# Patient Record
Sex: Female | Born: 1985 | Race: White | Hispanic: No | Marital: Single | Smoking: Former smoker
Health system: Southern US, Community
[De-identification: ages and names within clinical notes are randomized; demographics above are authoritative.]

## PROBLEM LIST (undated history)

## (undated) DIAGNOSIS — R2230 Localized swelling, mass and lump, unspecified upper limb: Secondary | ICD-10-CM

## (undated) DIAGNOSIS — E669 Obesity, unspecified: Secondary | ICD-10-CM

## (undated) DIAGNOSIS — F419 Anxiety disorder, unspecified: Secondary | ICD-10-CM

## (undated) DIAGNOSIS — F32A Depression, unspecified: Secondary | ICD-10-CM

## (undated) DIAGNOSIS — D259 Leiomyoma of uterus, unspecified: Secondary | ICD-10-CM

## (undated) DIAGNOSIS — F329 Major depressive disorder, single episode, unspecified: Secondary | ICD-10-CM

## (undated) HISTORY — PX: US LT BREAST BX W LOC DEV 1ST LESION IMG BX SPEC US GUIDE: IMG5431

## (undated) HISTORY — DX: Obesity, unspecified: E66.9

## (undated) HISTORY — DX: Leiomyoma of uterus, unspecified: D25.9

## (undated) HISTORY — PX: BREAST BIOPSY: SHX20

## (undated) HISTORY — PX: TUBAL LIGATION: SHX77

---
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
## 2004-11-03 ENCOUNTER — Emergency Department: Payer: Self-pay | Admitting: Emergency Medicine

## 2004-11-06 ENCOUNTER — Observation Stay: Payer: Self-pay | Admitting: Obstetrics & Gynecology

## 2004-11-07 ENCOUNTER — Emergency Department: Payer: Self-pay | Admitting: Emergency Medicine

## 2004-12-08 ENCOUNTER — Emergency Department: Payer: Self-pay | Admitting: Emergency Medicine

## 2005-12-19 ENCOUNTER — Emergency Department: Payer: Self-pay | Admitting: Emergency Medicine

## 2006-01-21 ENCOUNTER — Emergency Department: Payer: Self-pay | Admitting: General Practice

## 2006-03-16 ENCOUNTER — Emergency Department: Payer: Self-pay | Admitting: Emergency Medicine

## 2006-05-04 ENCOUNTER — Observation Stay: Payer: Self-pay | Admitting: Unknown Physician Specialty

## 2006-07-04 ENCOUNTER — Observation Stay: Payer: Self-pay

## 2006-07-09 ENCOUNTER — Observation Stay: Payer: Self-pay

## 2006-07-12 ENCOUNTER — Observation Stay: Payer: Self-pay | Admitting: Obstetrics & Gynecology

## 2006-07-19 ENCOUNTER — Inpatient Hospital Stay: Payer: Self-pay | Admitting: Obstetrics & Gynecology

## 2006-12-17 ENCOUNTER — Ambulatory Visit: Payer: Self-pay | Admitting: Obstetrics & Gynecology

## 2006-12-25 ENCOUNTER — Ambulatory Visit: Payer: Self-pay | Admitting: Obstetrics & Gynecology

## 2007-11-22 ENCOUNTER — Ambulatory Visit: Payer: Self-pay | Admitting: Obstetrics & Gynecology

## 2007-11-26 ENCOUNTER — Ambulatory Visit: Payer: Self-pay | Admitting: Obstetrics & Gynecology

## 2007-11-30 ENCOUNTER — Inpatient Hospital Stay: Payer: Self-pay

## 2008-06-24 ENCOUNTER — Emergency Department: Payer: Self-pay | Admitting: Emergency Medicine

## 2008-09-02 ENCOUNTER — Emergency Department: Payer: Self-pay | Admitting: Emergency Medicine

## 2009-05-23 ENCOUNTER — Emergency Department: Payer: Self-pay | Admitting: Emergency Medicine

## 2009-08-16 ENCOUNTER — Emergency Department: Payer: Self-pay | Admitting: Emergency Medicine

## 2010-10-20 ENCOUNTER — Emergency Department: Payer: Self-pay | Admitting: Emergency Medicine

## 2013-09-19 ENCOUNTER — Emergency Department: Payer: Self-pay | Admitting: Emergency Medicine

## 2014-03-18 ENCOUNTER — Emergency Department: Payer: Self-pay | Admitting: Emergency Medicine

## 2014-03-20 ENCOUNTER — Emergency Department: Payer: Self-pay | Admitting: Emergency Medicine

## 2014-06-15 ENCOUNTER — Emergency Department: Payer: Self-pay | Admitting: Emergency Medicine

## 2014-06-16 ENCOUNTER — Emergency Department: Payer: Self-pay | Admitting: Emergency Medicine

## 2014-12-23 ENCOUNTER — Ambulatory Visit: Payer: Self-pay

## 2015-01-04 ENCOUNTER — Ambulatory Visit: Payer: Self-pay | Admitting: General Surgery

## 2015-01-06 ENCOUNTER — Ambulatory Visit (INDEPENDENT_AMBULATORY_CARE_PROVIDER_SITE_OTHER): Payer: PRIVATE HEALTH INSURANCE | Admitting: General Surgery

## 2015-01-06 ENCOUNTER — Encounter: Payer: Self-pay | Admitting: General Surgery

## 2015-01-06 VITALS — BP 120/78 | HR 82 | Resp 12 | Ht 65.0 in | Wt 212.0 lb

## 2015-01-06 DIAGNOSIS — D1720 Benign lipomatous neoplasm of skin and subcutaneous tissue of unspecified limb: Secondary | ICD-10-CM | POA: Insufficient documentation

## 2015-01-06 NOTE — Progress Notes (Signed)
Patient ID: Shelby Curtis, female   DOB: 03/23/1986, 29 y.o.   MRN: [id number]  Chief Complaint  Patient presents with  . Breast Problem    right axilla mass    HPI Shelby Curtis is a 29 y.o. female.  who presents for a breast evaluation. The most recent mammogram and ultrasound was done on 12-23-14.  Patient does perform regular self breast checks. She states she noticed a knot in her right axilla around December. She states it has gotten larger over the past 2 months. Denies pain or breast injury. She has two children, 11 and 8.  HPI  No past medical history on file.  Past Surgical History  Procedure Laterality Date  . Cesarean section  2004, 2007  . Tubal ligation      Family History  Problem Relation Age of Onset  . Cancer Maternal Grandmother 32    breast  . Cancer Paternal Grandmother 11    breast    Social History History  Substance Use Topics  . Smoking status: Never Smoker   . Smokeless tobacco: Never Used  . Alcohol Use: No    No Known Allergies  Current Outpatient Prescriptions  Medication Sig Dispense Refill  . EPINEPHrine 1 MG/ML KIT Inject as directed as needed.    . Inulin (FIBER CHOICE PO) Take 2 tablets by mouth daily.     No current facility-administered medications for this visit.    Review of Systems Review of Systems  Constitutional: Negative.   Respiratory: Negative.   Cardiovascular: Negative.   Neurological: Positive for headaches.    Blood pressure 120/78, pulse 82, resp. rate 12, height '5\' 5"'  (1.651 m), weight 212 lb (96.163 kg), last menstrual period 11/21/2014.  Physical Exam Physical Exam  Constitutional: She is oriented to person, place, and time. She appears well-developed and well-nourished.  Neck: Neck supple.  Cardiovascular: Normal rate, regular rhythm and normal heart sounds.   Pulmonary/Chest: Effort normal and breath sounds normal. Right breast exhibits no inverted nipple, no mass, no nipple discharge, no skin change  and no tenderness. Left breast exhibits no inverted nipple, no mass, no nipple discharge, no skin change and no tenderness.    Transverse crease bilateral nipples.  Lymphadenopathy:    She has no cervical adenopathy.    She has axillary adenopathy.  Soft fullness base of right axilla 6 x 8 cm.  Neurological: She is alert and oriented to person, place, and time.  Skin: Skin is warm and dry.    Data Reviewed Bilateral mammograms dated 12/23/2014 and right axillary sound were reviewed. Mammograms were unremarkable. Ultrasound showed evidence of a 6 x 6 cm isoechoic area thought to represent a lipoma. BI-RADS-2.  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     Plan    Options for management were reviewed: 1) observation with excision of the area continues to increase in size versus 2) elective excision under general anesthesia. Pros and cons of both were reviewed. There is essentially no clinical suspicion for malignancy based on review of her mammograms (no breast parenchyma in the area) as well as her ultrasound and clinical examination.  Has areas presently pain-free, she is elected observation.  She's been discouraged from manipulating this area, but was encouraged to call if she develops pain or appreciate marked enlargement.     Follow up in 3 months.  PCP: Princella Ion Ref : Koren Shiver RN/[facility]  Robert Bellow 01/06/2015, 8:23 PM

## 2015-01-06 NOTE — Patient Instructions (Signed)
The patient is aware to call back for any questions or concerns. Continue self breast exams. Call office for any new breast issues or concerns. Breast Self-Awareness Practicing breast self-awareness may pick up problems early, prevent significant medical complications, and possibly save your life. By practicing breast self-awareness, you can become familiar with how your breasts look and feel and if your breasts are changing. This allows you to notice changes early. It can also offer you some reassurance that your breast health is good. One way to learn what is normal for your breasts and whether your breasts are changing is to do a breast self-exam. If you find a lump or something that was not present in the past, it is best to contact your caregiver right away. Other findings that should be evaluated by your caregiver include nipple discharge, especially if it is bloody; skin changes or reddening; areas where the skin seems to be pulled in (retracted); or new lumps and bumps. Breast pain is seldom associated with cancer (malignancy), but should also be evaluated by a caregiver. HOW TO PERFORM A BREAST SELF-EXAM The best time to examine your breasts is 5-7 days after your menstrual period is over. During menstruation, the breasts are lumpier, and it may be more difficult to pick up changes. If you do not menstruate, have reached menopause, or had your uterus removed (hysterectomy), you should examine your breasts at regular intervals, such as monthly. If you are breastfeeding, examine your breasts after a feeding or after using a breast pump. Breast implants do not decrease the risk for lumps or tumors, so continue to perform breast self-exams as recommended. Talk to your caregiver about how to determine the difference between the implant and breast tissue. Also, talk about the amount of pressure you should use during the exam. Over time, you will become more familiar with the variations of your breasts and  more comfortable with the exam. A breast self-exam requires you to remove all your clothes above the waist. 1. Look at your breasts and nipples. Stand in front of a mirror in a room with good lighting. With your hands on your hips, push your hands firmly downward. Look for a difference in shape, contour, and size from one breast to the other (asymmetry). Asymmetry includes puckers, dips, or bumps. Also, look for skin changes, such as reddened or scaly areas on the breasts. Look for nipple changes, such as discharge, dimpling, repositioning, or redness. 2. Carefully feel your breasts. This is best done either in the shower or tub while using soapy water or when flat on your back. Place the arm (on the side of the breast you are examining) above your head. Use the pads (not the fingertips) of your three middle fingers on your opposite hand to feel your breasts. Start in the underarm area and use  inch (2 cm) overlapping circles to feel your breast. Use 3 different levels of pressure (light, medium, and firm pressure) at each circle before moving to the next circle. The light pressure is needed to feel the tissue closest to the skin. The medium pressure will help to feel breast tissue a little deeper, while the firm pressure is needed to feel the tissue close to the ribs. Continue the overlapping circles, moving downward over the breast until you feel your ribs below your breast. Then, move one finger-width towards the center of the body. Continue to use the  inch (2 cm) overlapping circles to feel your breast as you move slowly up  toward the collar bone (clavicle) near the base of the neck. Continue the up and down exam using all 3 pressures until you reach the middle of the chest. Do this with each breast, carefully feeling for lumps or changes. 3.  Keep a written record with breast changes or normal findings for each breast. By writing this information down, you do not need to depend only on memory for size,  tenderness, or location. Write down where you are in your menstrual cycle, if you are still menstruating. Breast tissue can have some lumps or thick tissue. However, see your caregiver if you find anything that concerns you.  SEEK MEDICAL CARE IF:  You see a change in shape, contour, or size of your breasts or nipples.   You see skin changes, such as reddened or scaly areas on the breasts or nipples.   You have an unusual discharge from your nipples.   You feel a new lump or unusually thick areas.  Document Released: 10/09/2005 Document Revised: 09/25/2012 Document Reviewed: 01/24/2012 [facility] Patient Information 2015 [place], [place]. This information is not intended to replace advice given to you by your health care provider. Make sure you discuss any questions you have with your health care provider.

## 2015-04-12 ENCOUNTER — Ambulatory Visit: Payer: PRIVATE HEALTH INSURANCE | Admitting: General Surgery

## 2015-05-25 ENCOUNTER — Encounter: Payer: Self-pay | Admitting: *Deleted

## 2015-08-20 ENCOUNTER — Encounter: Payer: Self-pay | Admitting: Emergency Medicine

## 2015-08-20 ENCOUNTER — Emergency Department
Admission: EM | Admit: 2015-08-20 | Discharge: 2015-08-20 | Disposition: A | Discharge status: Home / Self Care | Payer: Self-pay | Attending: Emergency Medicine | Admitting: Emergency Medicine

## 2015-08-20 DIAGNOSIS — N939 Abnormal uterine and vaginal bleeding, unspecified: Secondary | ICD-10-CM | POA: Insufficient documentation

## 2015-08-20 DIAGNOSIS — Z3202 Encounter for pregnancy test, result negative: Secondary | ICD-10-CM | POA: Insufficient documentation

## 2015-08-20 DIAGNOSIS — Z79899 Other long term (current) drug therapy: Secondary | ICD-10-CM | POA: Insufficient documentation

## 2015-08-20 LAB — BASIC METABOLIC PANEL
Anion gap: 6 (ref 5–15)
BUN: 17 mg/dL (ref 6–20)
CHLORIDE: 107 mmol/L (ref 101–111)
CO2: 25 mmol/L (ref 22–32)
Calcium: 9.3 mg/dL (ref 8.9–10.3)
Creatinine, Ser: 0.62 mg/dL (ref 0.44–1.00)
GFR calc Af Amer: >60 mL/min (ref >60)
GFR calc non Af Amer: >60 mL/min (ref >60)
GLUCOSE: 112 mg/dL — AB (ref 65–99)
POTASSIUM: 3.5 mmol/L (ref 3.5–5.1)
Sodium: 138 mmol/L (ref 135–145)

## 2015-08-20 LAB — CBC WITH DIFFERENTIAL/PLATELET
Basophils Absolute: 0.1 10*3/uL (ref 0–0.1)
Basophils Relative: 1 %
EOS PCT: 1 %
Eosinophils Absolute: 0.1 10*3/uL (ref 0–0.7)
HEMATOCRIT: 37.1 % (ref 35.0–47.0)
Hemoglobin: 12.1 g/dL (ref 12.0–16.0)
LYMPHS ABS: 1.9 10*3/uL (ref 1.0–3.6)
LYMPHS PCT: 22 %
MCH: 26.6 pg (ref 26.0–34.0)
MCHC: 32.6 g/dL (ref 32.0–36.0)
MCV: 81.6 fL (ref 80.0–100.0)
MONO ABS: 0.7 10*3/uL (ref 0.2–0.9)
Monocytes Relative: 8 %
Neutro Abs: 5.9 10*3/uL (ref 1.4–6.5)
Neutrophils Relative %: 68 %
PLATELETS: 225 10*3/uL (ref 150–440)
RBC: 4.55 MIL/uL (ref 3.80–5.20)
RDW: 13.4 % (ref 11.5–14.5)
WBC: 8.7 10*3/uL (ref 3.6–11.0)

## 2015-08-20 LAB — CHLAMYDIA/NGC RT PCR (ARMC ONLY)
Chlamydia Tr: NOT DETECTED
N GONORRHOEAE: NOT DETECTED

## 2015-08-20 LAB — WET PREP, GENITAL
TRICH WET PREP: NONE SEEN
YEAST WET PREP: NONE SEEN

## 2015-08-20 LAB — HCG, QUANTITATIVE, PREGNANCY: hCG, Beta Chain, Quant, S: <1 m[IU]/mL (ref <5)

## 2015-08-20 MED ORDER — MEDROXYPROGESTERONE ACETATE 5 MG PO TABS: 5.0000 mg | ORAL_TABLET | Freq: Every day | ORAL | Status: DC

## 2015-08-20 NOTE — ED Provider Notes (Signed)
[facility] Emergency Department Provider Note  ____________________________________________  Time seen: Approximately 11:11 AM  I have reviewed the triage vital signs and the nursing notes.   HISTORY  Chief Complaint Vaginal Bleeding   HPI Shelby Curtis is a 29 y.o. female here complaining of vaginal bleeding for 12 days. Patient states that she did not have a period the month of September. She is not using any birth control methods but states that she did have a tubal ligation 9 years ago. She also reports some mild lower abdominal cramping. Currently she states she is using one tampon per day but continues to pass some clots. She states prior to the bleeding that she did have some vaginal discharge. Currently she rates her pain is 7 out of 10.   History reviewed. No pertinent past medical history.  Patient Active Problem List   Diagnosis Date Noted  . 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 Cesarean section  2004, 2007  . Tubal ligation      Current Outpatient Rx  Name  Route  Sig  Dispense  Refill  . EPINEPHrine 1 MG/ML KIT   Injection   Inject as directed as needed.         . Inulin (FIBER CHOICE PO)   Oral   Take 2 tablets by mouth daily.         . medroxyPROGESTERone (PROVERA) 5 MG tablet   Oral   Take 1 tablet (5 mg total) by mouth daily.   7 tablet   0     Allergies Review of patient's allergies indicates no known allergies.  Family History  Problem Relation Age of Onset  . Cancer Maternal Grandmother 20    breast  . Cancer Paternal Grandmother 108    breast    Social History Social History  Substance Use Topics  . Smoking status: Never Smoker   . Smokeless tobacco: Never Used  . Alcohol Use: No    Review of Systems Constitutional: No fever/chills Cardiovascular: Denies chest pain. Respiratory: Denies shortness of breath. Gastrointestinal: Positive lower abdominal  pain.  No nausea, no vomiting.   Genitourinary: Negative for dysuria. Musculoskeletal: Negative for back pain. Skin: Negative for rash. Neurological: Negative for headaches, focal weakness or numbness.  10-point ROS otherwise negative.  ____________________________________________   PHYSICAL EXAM:  VITAL SIGNS: ED Triage Vitals  Enc Vitals Group     BP 08/20/15 1056 132/81 mmHg     Pulse Rate 08/20/15 1056 94     Resp 08/20/15 1056 20     Temp 08/20/15 1056 98.2 F (36.8 C)     Temp Source 08/20/15 1056 Oral     SpO2 08/20/15 1056 99 %     Weight 08/20/15 1056 212 lb (96.163 kg)     Height 08/20/15 1056 '5\' 5"'  (1.651 m)     Head Cir --      Peak Flow --      Pain Score 08/20/15 1056 7     Pain Loc --      Pain Edu? --      Excl. in [place]? --     Constitutional: Alert and oriented. Well appearing and in no acute distress. Eyes: Conjunctivae are normal. PERRL. EOMI. Head: Atraumatic. Nose: No congestion/rhinnorhea. Neck: No stridor.   Cardiovascular: Normal rate, regular rhythm. Grossly normal heart sounds.  Good peripheral circulation. Respiratory: Normal respiratory effort.  No retractions. Lungs CTAB. Gastrointestinal: Soft and  nontender. No distention. No abdominal bruits. No CVA tenderness. Genitourinary: There is minimal blood noted in the vaginal vault. This appears to be old rather than bright red. There is no tenderness on cervical motion and no adnexal masses or tenderness was noted. Musculoskeletal: No lower extremity tenderness nor edema.  No joint effusions. Neurologic:  Normal speech and language. No gross focal neurologic deficits are appreciated. No gait instability. Skin:  Skin is warm, dry and intact. No rash noted. Psychiatric: Mood and affect are normal. Speech and behavior are normal.  ____________________________________________   LABS (all labs ordered are listed, but only abnormal results are displayed)  Labs Reviewed  WET PREP, GENITAL -  Abnormal; Notable for the following:    Clue Cells Wet Prep HPF POC FEW (*)    WBC, Wet Prep HPF POC FEW (*)    All other components within normal limits  BASIC METABOLIC PANEL - Abnormal; Notable for the following:    Glucose, Bld 112 (*)    All other components within normal limits  CHLAMYDIA/NGC RT PCR ([facility] ONLY)  CBC WITH DIFFERENTIAL/PLATELET  HCG, QUANTITATIVE, PREGNANCY     PROCEDURES  Procedure(s) performed: None  Critical Care performed: No  ____________________________________________   INITIAL IMPRESSION / ASSESSMENT AND PLAN / ED COURSE  Pertinent labs & imaging results that were available during my care of the patient were reviewed by me and considered in my medical decision making (see chart for details).  Patient was started on Provera 5 mg one daily for 7 days. She was told that this would cause withdrawal bleeding that should clear up after that. Patient is to follow-up with Dr. Ilda Basset. She is aware that she is to call and make an appointment. ____________________________________________   FINAL CLINICAL IMPRESSION(S) / ED DIAGNOSES  Final diagnoses:  Abnormal uterine bleeding      Johnn Hai, PA-C 08/20/15 1721  Lisa Roca, MD 08/21/15 1606

## 2015-08-20 NOTE — ED Notes (Signed)
Pt to ed with c/o vaginal bleeding x 12 days.  Pt states she did not have a period in September.  Pt reports mild lower abd cramping.

## 2015-08-20 NOTE — Discharge Instructions (Signed)
Abnormal Uterine Bleeding Abnormal uterine bleeding means bleeding from the vagina that is not your normal menstrual period. This can be:  Bleeding or spotting between periods.  Bleeding after sex (sexual intercourse).  Bleeding that is heavier or more than normal.  Periods that last longer than usual.  Bleeding after menopause. There are many problems that may cause this. Treatment will depend on the cause of the bleeding. Any kind of bleeding that is not normal should be reviewed by your doctor.  HOME CARE Watch your condition for any changes. These actions may lessen any discomfort you are having:  Do not use tampons or douches as told by your doctor.  Change your pads often. You should get regular pelvic exams and Pap tests. Keep all appointments for tests as told by your doctor. GET HELP IF:  You are bleeding for more than 1 week.  You feel dizzy at times. GET HELP RIGHT AWAY IF:   You pass out.  You have to change pads every 15 to 30 minutes.  You have belly pain.  You have a fever.  You become sweaty or weak.  You are passing large blood clots from the vagina.  You feel sick to your stomach (nauseous) and throw up (vomit). MAKE SURE YOU:  Understand these instructions.  Will watch your condition.  Will get help right away if you are not doing well or get worse.   This information is not intended to replace advice given to you by your health care provider. Make sure you discuss any questions you have with your health care provider.   Document Released: 08/06/2009 Document Revised: 10/14/2013 Document Reviewed: 05/08/2013 Elsevier Interactive Patient Education 2016 [place] [address]. PICKENS IF ANY CONTINUED PROBLEMS.   YOU WILL NEED TO CALL FOR AN APPOINTMENT TAKE PROVERA ONCE A DAY FOR 7 DAYS, THEN YOU WILL EXPECT TO SEE MORE BLOOD BEFORE STOPPING

## 2016-11-27 ENCOUNTER — Emergency Department: Payer: Medicaid Other

## 2016-11-27 ENCOUNTER — Encounter: Payer: Self-pay | Admitting: Emergency Medicine

## 2016-11-27 ENCOUNTER — Emergency Department
Admission: EM | Admit: 2016-11-27 | Discharge: 2016-11-27 | Disposition: A | Discharge status: Home / Self Care | Payer: Medicaid Other | Attending: Emergency Medicine | Admitting: Emergency Medicine

## 2016-11-27 DIAGNOSIS — J101 Influenza due to other identified influenza virus with other respiratory manifestations: Secondary | ICD-10-CM

## 2016-11-27 DIAGNOSIS — J09X2 Influenza due to identified novel influenza A virus with other respiratory manifestations: Secondary | ICD-10-CM | POA: Insufficient documentation

## 2016-11-27 DIAGNOSIS — R079 Chest pain, unspecified: Secondary | ICD-10-CM

## 2016-11-27 DIAGNOSIS — R509 Fever, unspecified: Secondary | ICD-10-CM | POA: Diagnosis present

## 2016-11-27 DIAGNOSIS — Z79899 Other long term (current) drug therapy: Secondary | ICD-10-CM | POA: Diagnosis not present

## 2016-11-27 LAB — CBC WITH DIFFERENTIAL/PLATELET
BASOS PCT: 0 %
Basophils Absolute: 0 10*3/uL (ref 0–0.1)
EOS ABS: 0.1 10*3/uL (ref 0–0.7)
Eosinophils Relative: 1 %
HCT: 35.8 % (ref 35.0–47.0)
Hemoglobin: 12.8 g/dL (ref 12.0–16.0)
Lymphocytes Relative: 13 %
Lymphs Abs: 1.2 10*3/uL (ref 1.0–3.6)
MCH: 28.2 pg (ref 26.0–34.0)
MCHC: 35.7 g/dL (ref 32.0–36.0)
MCV: 79.1 fL — ABNORMAL LOW (ref 80.0–100.0)
MONOS PCT: 9 %
Monocytes Absolute: 0.8 10*3/uL (ref 0.2–0.9)
Neutro Abs: 7.2 10*3/uL — ABNORMAL HIGH (ref 1.4–6.5)
Neutrophils Relative %: 77 %
Platelets: 246 10*3/uL (ref 150–440)
RBC: 4.52 MIL/uL (ref 3.80–5.20)
RDW: 13.5 % (ref 11.5–14.5)
WBC: 9.4 10*3/uL (ref 3.6–11.0)

## 2016-11-27 LAB — BASIC METABOLIC PANEL
Anion gap: 8 (ref 5–15)
BUN: 16 mg/dL (ref 6–20)
CALCIUM: 8.8 mg/dL — AB (ref 8.9–10.3)
CO2: 24 mmol/L (ref 22–32)
CREATININE: 0.69 mg/dL (ref 0.44–1.00)
Chloride: 105 mmol/L (ref 101–111)
GFR calc non Af Amer: >60 mL/min (ref >60)
Glucose, Bld: 97 mg/dL (ref 65–99)
Potassium: 3 mmol/L — ABNORMAL LOW (ref 3.5–5.1)
Sodium: 137 mmol/L (ref 135–145)

## 2016-11-27 LAB — INFLUENZA PANEL BY PCR (TYPE A & B)
INFLBPCR: NEGATIVE
Influenza A By PCR: POSITIVE — AB

## 2016-11-27 MED ORDER — POTASSIUM CHLORIDE CRYS ER 20 MEQ PO TBCR
40.0000 meq | EXTENDED_RELEASE_TABLET | Freq: Once | ORAL | Status: AC
  Administered 2016-11-27: 40 meq via ORAL

## 2016-11-27 MED ORDER — ONDANSETRON 4 MG PO TBDP: 4.0000 mg | ORAL_TABLET | Freq: Four times a day (QID) | ORAL | 0 refills | Status: DC | PRN

## 2016-11-27 MED ORDER — IBUPROFEN 800 MG PO TABS
ORAL_TABLET | ORAL | Status: AC
  Administered 2016-11-27: 800 mg via ORAL
  Filled 2016-11-27: qty 1

## 2016-11-27 MED ORDER — ACETAMINOPHEN 500 MG PO TABS
ORAL_TABLET | ORAL | Status: AC
  Administered 2016-11-27: 1000 mg via ORAL
  Filled 2016-11-27: qty 2

## 2016-11-27 MED ORDER — IBUPROFEN 800 MG PO TABS
800.0000 mg | ORAL_TABLET | ORAL | Status: AC
  Administered 2016-11-27: 800 mg via ORAL

## 2016-11-27 MED ORDER — ONDANSETRON HCL 4 MG/2ML IJ SOLN
4.0000 mg | Freq: Once | INTRAMUSCULAR | Status: AC
  Administered 2016-11-27: 4 mg via INTRAVENOUS

## 2016-11-27 MED ORDER — POTASSIUM CHLORIDE CRYS ER 20 MEQ PO TBCR
EXTENDED_RELEASE_TABLET | ORAL | Status: AC
  Administered 2016-11-27: 40 meq via ORAL
  Filled 2016-11-27: qty 2

## 2016-11-27 MED ORDER — SODIUM CHLORIDE 0.9 % IV BOLUS (SEPSIS)
1000.0000 mL | Freq: Once | INTRAVENOUS | Status: AC
  Administered 2016-11-27: 1000 mL via INTRAVENOUS

## 2016-11-27 MED ORDER — ACETAMINOPHEN 500 MG PO TABS
1000.0000 mg | ORAL_TABLET | ORAL | Status: AC
  Administered 2016-11-27: 1000 mg via ORAL

## 2016-11-27 MED ORDER — ONDANSETRON HCL 4 MG/2ML IJ SOLN
INTRAMUSCULAR | Status: AC
  Administered 2016-11-27: 4 mg via INTRAVENOUS
  Filled 2016-11-27: qty 2

## 2016-11-27 NOTE — ED Provider Notes (Signed)
[facility] Emergency Department Provider Note   ____________________________________________   First MD Initiated Contact with Patient 11/27/16 2112     (approximate)  I have reviewed the triage vital signs and the nursing notes.   HISTORY  Chief Complaint Flu-like symptoms    HPI Shelby Curtis is a 31 y.o. female reports for the last 4 days she is experience muscle aches, fevers, chills, a dry nonproductive cough, has had a few episodes of vomiting, nausea and not wanting to eat well.  She has slight runny nose and also a dry cough.  Denies pregnancy. No abdominal pain, but has had vomiting over the last few days. 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  Denies any shortness of breath or chest pain.  No past medical history on file.  Patient Active Problem List   Diagnosis Date Noted  . 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 CESAREAN SECTION  2004, 2007  . TUBAL LIGATION      Prior to Admission medications   Medication Sig Start Date End Date Taking? Authorizing Provider  EPINEPHrine 1 MG/ML KIT Inject as directed as needed.    Historical Provider, MD  Inulin (FIBER CHOICE PO) Take 2 tablets by mouth daily.    Historical Provider, MD  medroxyPROGESTERone (PROVERA) 5 MG tablet Take 1 tablet (5 mg total) by mouth daily. 08/20/15 08/19/16  Johnn Hai, PA-C  ondansetron (ZOFRAN ODT) 4 MG disintegrating tablet Take 1 tablet (4 mg total) by mouth every 6 (six) hours as needed for nausea or vomiting. 11/27/16   Delman Kitten, MD    Allergies Patient has no known allergies.  Family History  Problem Relation Age of Onset  . Cancer Maternal Grandmother 9    breast  . Cancer Paternal Grandmother 62    breast    Social History Social History    Substance Use Topics  . Smoking status: Never Smoker  . Smokeless tobacco: Never Used  . Alcohol use No    Review of Systems Constitutional: Fevers and chills Eyes: No visual changes. ENT: No sore throat. Cardiovascular: Denies chest pain. Respiratory: Denies shortness of breath. Dry cough Gastrointestinal: No abdominal pain.  Occasional nausea and vomiting No diarrhea.  No constipation. Genitourinary: Negative for dysuria. Musculoskeletal: Negative for back pain. Skin: Negative for rash. Neurological: Negative for headaches, focal weakness or numbness.  10-point ROS otherwise negative.  ____________________________________________   PHYSICAL EXAM:  VITAL SIGNS: ED Triage Vitals  Enc Vitals Group     BP 11/27/16 1811 127/74     Pulse Rate 11/27/16 1811 (!) 130     Resp 11/27/16 1811 18     Temp 11/27/16 1811 99.4 F (37.4 C)     Temp Source 11/27/16 1811 Oral     SpO2 11/27/16 1811 96 %     Weight 11/27/16 1811 211 lb (95.7 kg)     Height 11/27/16 1811 _0  (1.651 m)     Head Circumference --      Peak Flow --      Pain Score 11/27/16 1812 6     Pain Loc --      Pain Edu? --      Excl. in [place]? --     Constitutional: Alert and oriented. Mildly ill-appearing, but  in no acute distress. Nontoxic in appearance. Patient and her mother very pleasant. Eyes: Conjunctivae are normal. PERRL. EOMI. Head: Atraumatic. Nose: Mild clear rhinorrhea Mouth/Throat: Mucous membranes are moist.  Oropharynx non-erythematous. Neck: No stridor.   Cardiovascular: Minimally tachycardic rate, regular rhythm. Grossly normal heart sounds.  Good peripheral circulation. Respiratory: Normal respiratory effort.  No retractions. Lungs CTAB. No wheezing. Speaks in full sentences without distress. Gastrointestinal: Soft and nontender. No distention.  Musculoskeletal: No lower extremity tenderness nor edema.  No joint effusions. Neurologic:  Normal speech and language. No gross focal neurologic  deficits are appreciated. No gait instability. Able to walk without difficulty. Skin:  Skin is warm, dry and intact. No rash noted. Psychiatric: Mood and affect are normal. Speech and behavior are normal.  ____________________________________________   LABS (all labs ordered are listed, but only abnormal results are displayed)  Labs Reviewed  CBC WITH DIFFERENTIAL/PLATELET - Abnormal; Notable for the following:       Result Value   MCV 79.1 (*)    Neutro Abs 7.2 (*)    All other components within normal limits  BASIC METABOLIC PANEL - Abnormal; Notable for the following:    Potassium 3.0 (*)    Calcium 8.8 (*)    All other components within normal limits  INFLUENZA PANEL BY PCR (TYPE A & B) - Abnormal; Notable for the following:    Influenza A By PCR POSITIVE (*)    All other components within normal limits   ____________________________________________  EKG  Reviewed and interpreted by me at 6:30 PM Ventricular rate 1:30 QRS 90 QTc 440 Sinus tachycardia, no acute ischemic changes ____________________________________________  RADIOLOGY  Dg Chest 2 View  Result Date: 11/27/2016 CLINICAL DATA:  Cough and nausea.  Chest tightness. EXAM: CHEST  2 VIEW COMPARISON:  None. FINDINGS: Lungs are clear. Heart size and pulmonary vascularity are normal. No adenopathy. No bone lesions. No pneumothorax. IMPRESSION: No abnormality noted. Electronically Signed   By: Lowella Grip III M.D.   On: 11/27/2016 18:43    ____________________________________________   PROCEDURES  Procedure(s) performed: None  Procedures  Critical Care performed: No  ____________________________________________   INITIAL IMPRESSION / ASSESSMENT AND PLAN / ED COURSE  Pertinent labs & imaging results that were available during my care of the patient were reviewed by me and considered in my medical decision making (see chart for details).  Patient presents with viral-like symptoms, still slightly  tachycardic, but in no distress. Overall nontoxic, no hypoxia.   Lab tests reveal influenza A, which appears consistent with her clinical history. She has no evidence of decompensation, and her tachycardia has corrected after receiving fluids. Patient reports nausea improved and feeling improved, requesting be discharged about 10:40 PM. Patient reports overall feeling much better, she is awake alert and able to ambulate on her own without difficulty.  Appears appropriate for outpatient management of influenza. We did discuss that she's had symptoms for about 4 days, is on clear fluid have any significant bearing on her illness at this point, and after discussing with her and also discussing relevant side effects profile of Tamiflu patient elected not to receive a prescription for Tamiflu, but does wish and we'll provide a prescription for Zofran. I think is extremely reasonable, she has no evidence of complicating factor appears well and improved after hydration and antiemetics.  I advised the patient and her mother on very careful return precautions, especially given how severe this flu season has been. Patient and mother both are agreeable with  plan.    Vitals:   11/27/16 2100 11/27/16 2241  BP: 103/70 95/69  Pulse: 97 100  Resp: 20 18  Temp: 99.9 F (37.7 C) 98.8 F (37.1 C)     ____________________________________________   FINAL CLINICAL IMPRESSION(S) / ED DIAGNOSES  Final diagnoses:  Influenza A      NEW MEDICATIONS STARTED DURING THIS VISIT:  Discharge Medication List as of 11/27/2016 10:43 PM    START taking these medications   Details  ondansetron (ZOFRAN ODT) 4 MG disintegrating tablet Take 1 tablet (4 mg total) by mouth every 6 (six) hours as needed for nausea or vomiting., Starting Mon 11/27/2016, Print         Note:  This document was prepared using Dragon voice recognition software and may include unintentional dictation errors.     Delman Kitten, MD 11/27/16  2300

## 2016-11-27 NOTE — ED Notes (Signed)
Pt reports she has been feeling sick since Friday reports body aches, fever, pt talks in complete sentence no respiratory distress noted. Denies any other symptom at present

## 2016-11-27 NOTE — ED Notes (Signed)
Pt alert and oriented X4, active, cooperative, pt in NAD. RR even and unlabored, color WNL.  Pt informed to return if any life threatening symptoms occur.   

## 2016-11-27 NOTE — ED Triage Notes (Addendum)
Pt reports cough, body aches, nausea, diarrhea and runny nose for three to four days. Pt reports dizziness and chest tightness.

## 2016-11-27 NOTE — Discharge Instructions (Signed)

## 2016-12-04 ENCOUNTER — Ambulatory Visit: Payer: Self-pay | Attending: Oncology | Admitting: *Deleted

## 2016-12-04 VITALS — BP 114/78 | HR 89 | Temp 97.1°F | Ht 64.96 in | Wt 212.9 lb

## 2016-12-04 DIAGNOSIS — N63 Unspecified lump in unspecified breast: Secondary | ICD-10-CM

## 2016-12-04 NOTE — Patient Instructions (Signed)
Gave patient hand-out, Women Staying Healthy, Active and Well from [facility], with education on breast health, pap smears, heart and colon health. 

## 2016-12-04 NOTE — Progress Notes (Signed)
Subjective:     Patient ID: Shelby Curtis, female   DOB: [birth year], 31 y.o.   MRN: [id number]  HPI   Review of Systems     Objective:   Physical Exam  Pulmonary/Chest: Right breast exhibits inverted nipple and mass. Right breast exhibits no nipple discharge, no skin change and no tenderness. Left breast exhibits inverted nipple. Left breast exhibits no mass, no nipple discharge, no skin change and no tenderness. Breasts are asymmetrical.         Assessment:     31 year old white female returns to [facility] with complaints of an enlarging right axillary mass.  States she is experiencing numbness down the right arm, and pain when raising her arm.  Does state she has had a 60 lbs weight gain over the past 2 years.  Also states she has lost 17 lbs, and has not noticed any change in the size of the mass.   In review of the chart, patient was seen through [facility] 12/23/14 for the same complaints.  Patient had a birads 2 mammogram at that time and was also seen by Dr. Bary Castilla who noted this was probably a lipoma.  On clinical breast exam I can visually see a large asymmetry in the axilla.  There is an approximate 8 cm mobile mass in the right axilla that is much easier palpated in the semi-fowlers position than supine.  There is slight excess breast tissue also noted in the left axilla.  Patient states she is starting to have some pain also in the left axilla.  Taught self breast awareness. Patient has been screened for eligibility.  She does not have any insurance, Medicare or Medicaid.  She also meets financial eligibility.  Hand-out given on the Affordable Care Act.    Plan:     Bilateral diagnostic mammogram with ultrasound.  Patient will also need to be scheduled to follow-up with Dr. Bary Castilla for further evaluation.  She is agreeable to the plan.  Will follow-up per [facility] protocol.  I did explain to the patient that if this is actually a lipoma that needs to be excised we will probably need to have her  complete patient financial paperwork through [facility].  She is agreeable.

## 2016-12-05 ENCOUNTER — Encounter: Payer: Self-pay | Admitting: *Deleted

## 2016-12-07 ENCOUNTER — Encounter: Payer: Self-pay | Admitting: *Deleted

## 2016-12-12 ENCOUNTER — Ambulatory Visit (INDEPENDENT_AMBULATORY_CARE_PROVIDER_SITE_OTHER): Payer: PRIVATE HEALTH INSURANCE | Admitting: General Surgery

## 2016-12-12 ENCOUNTER — Encounter: Payer: Self-pay | Admitting: General Surgery

## 2016-12-12 VITALS — BP 122/92 | HR 98 | Resp 12 | Ht 65.0 in | Wt 214.0 lb

## 2016-12-12 DIAGNOSIS — R2231 Localized swelling, mass and lump, right upper limb: Secondary | ICD-10-CM | POA: Insufficient documentation

## 2016-12-12 NOTE — Progress Notes (Signed)
Patient ID: Shelby Curtis, female   DOB: [birth year], 31 y.o.   MRN: [id number]  Chief Complaint  Patient presents with  . Breast Problem    HPI Shelby Curtis is a 31 y.o. female.  Here today for evaluation of a right axillary mass. She was seen in 2016 for the same area of concern. She states the knot is larger. She states occasional tenderness. She has lost 15 pounds recently. She states her arms tend to go numb with her arms over her head while sleeping. Mammogram an ultrasound scheduled for 12-22-16.  HPI  No past medical history on file.  Past Surgical History:  Procedure Laterality Date  . CESAREAN SECTION  2004, 2007  . TUBAL LIGATION      Family History  Problem Relation Age of Onset  . Cancer Maternal Grandmother 5    breast  . Cancer Paternal Grandmother 38    breast    Social History Social History  Substance Use Topics  . Smoking status: Never Smoker  . Smokeless tobacco: Never Used  . Alcohol use No    No Known Allergies  Current Outpatient Prescriptions  Medication Sig Dispense Refill  . EPINEPHrine 1 MG/ML KIT Inject as directed as needed.     No current facility-administered medications for this visit.     Review of Systems Review of Systems  Constitutional: Negative.   Respiratory: Negative.   Cardiovascular: Negative.     Blood pressure (!) 122/92, pulse 98, resp. rate 12, height '5\' 5"'  (1.651 m), weight 214 lb (97.1 kg), last menstrual period 11/27/2016.  Physical Exam Physical Exam  Constitutional: She is oriented to person, place, and time. She appears well-developed and well-nourished.  HENT:  Mouth/Throat: Oropharynx is clear and moist.  Eyes: Conjunctivae are normal. No scleral icterus.  Neck: Neck supple.  Cardiovascular: Normal rate, regular rhythm and normal heart sounds.   Pulmonary/Chest: Effort normal and breath sounds normal. Right breast exhibits no inverted nipple, no mass, no nipple discharge, no skin change and no  tenderness. Left breast exhibits no inverted nipple, no mass, no nipple discharge, no skin change and no tenderness.    Lymphadenopathy:    She has no cervical adenopathy.  Right axillary mass 8 x 12 cm.  Neurological: She is alert and oriented to person, place, and time.  Skin: Skin is warm and dry.  Psychiatric: Her behavior is normal.    Data Reviewed 12/23/2014 mammogram and right axillary ultrasound reviewed. No images available. 6 x 6 x 2 cm homogeneous area possibly representing a lipoma.  Assessment    Enlarging right axillary mass, lipoma versus ectopic breast tissue.    Plan    Patient will proceed with scheduled mammogram and ultrasound per the [facility] coordinator. She is mass to notify the office today her mammograms are completed so they can be reviewed.  We'll contact Mrs. Quentin Ore after mammograms to determine if the patient is eligible for pink ribbon fonds.    Recommend excision of the area. The patient is aware to call back for any questions or new concerns.   This information has been scribed by Karie Fetch RN, BSN,BC.   Robert Bellow 12/12/2016, 3:17 PM

## 2016-12-12 NOTE — Patient Instructions (Signed)
The patient is aware to call back for any questions or concerns.  

## 2016-12-22 ENCOUNTER — Ambulatory Visit
Admission: RE | Admit: 2016-12-22 | Discharge: 2016-12-22 | Disposition: A | Discharge status: Home / Self Care | Payer: Self-pay | Source: Ambulatory Visit | Attending: Oncology | Admitting: Oncology

## 2016-12-22 DIAGNOSIS — N63 Unspecified lump in unspecified breast: Secondary | ICD-10-CM

## 2017-01-18 ENCOUNTER — Telehealth: Payer: Self-pay | Admitting: *Deleted

## 2017-01-18 NOTE — Telephone Encounter (Signed)
Called and talked with patient today in regards to her mammogram, ultrasound results and conversation with Dr. Bary Castilla.  Patient states she would like to have the lipoma's removed.  She understands [facility] cannot pay for surgery, but she can complete forms for financial assistance from [facility].  She is going to pick up the paperwork tomorrow and bring them back when completed.  She is to discuss this with Dr. Bary Castilla.

## 2017-02-05 ENCOUNTER — Telehealth: Payer: Self-pay | Admitting: *Deleted

## 2017-02-05 NOTE — Telephone Encounter (Signed)
Patient contacted the office today wanting to know if she could schedule an excision.   This patient states [facility] will not cover cost. She is working on trying to obtain financial assistance/Medicaid.   Per Gwenette Greet, we will need to wait until patient is approved for assistance/Medicaid before we schedule and excision will need to be completed under date ranges of approval.

## 2017-07-04 ENCOUNTER — Encounter: Payer: Self-pay | Admitting: Emergency Medicine

## 2017-07-04 DIAGNOSIS — K529 Noninfective gastroenteritis and colitis, unspecified: Secondary | ICD-10-CM | POA: Insufficient documentation

## 2017-07-04 DIAGNOSIS — Z87891 Personal history of nicotine dependence: Secondary | ICD-10-CM | POA: Insufficient documentation

## 2017-07-04 DIAGNOSIS — N83201 Unspecified ovarian cyst, right side: Secondary | ICD-10-CM | POA: Insufficient documentation

## 2017-07-04 LAB — COMPREHENSIVE METABOLIC PANEL
ALK PHOS: 56 U/L (ref 38–126)
ALT: 15 U/L (ref 14–54)
ANION GAP: 9 (ref 5–15)
AST: 17 U/L (ref 15–41)
Albumin: 4.2 g/dL (ref 3.5–5.0)
BILIRUBIN TOTAL: 0.4 mg/dL (ref 0.3–1.2)
BUN: 13 mg/dL (ref 6–20)
CALCIUM: 9 mg/dL (ref 8.9–10.3)
CO2: 26 mmol/L (ref 22–32)
Chloride: 107 mmol/L (ref 101–111)
Creatinine, Ser: 0.63 mg/dL (ref 0.44–1.00)
GFR calc non Af Amer: >60 mL/min (ref >60)
Glucose, Bld: 117 mg/dL — ABNORMAL HIGH (ref 65–99)
POTASSIUM: 3.7 mmol/L (ref 3.5–5.1)
SODIUM: 142 mmol/L (ref 135–145)
TOTAL PROTEIN: 7.1 g/dL (ref 6.5–8.1)

## 2017-07-04 LAB — URINALYSIS, COMPLETE (UACMP) WITH MICROSCOPIC
BILIRUBIN URINE: NEGATIVE
Glucose, UA: NEGATIVE mg/dL
HGB URINE DIPSTICK: NEGATIVE
Ketones, ur: NEGATIVE mg/dL
Leukocytes, UA: NEGATIVE
NITRITE: NEGATIVE
Protein, ur: NEGATIVE mg/dL
Specific Gravity, Urine: 1.02 (ref 1.005–1.030)
WBC UA: NONE SEEN WBC/hpf (ref 0–5)
pH: 7.5 (ref 5.0–8.0)

## 2017-07-04 LAB — CBC
HEMATOCRIT: 37.5 % (ref 35.0–47.0)
HEMOGLOBIN: 12.6 g/dL (ref 12.0–16.0)
MCH: 27.5 pg (ref 26.0–34.0)
MCHC: 33.6 g/dL (ref 32.0–36.0)
MCV: 81.9 fL (ref 80.0–100.0)
Platelets: 260 10*3/uL (ref 150–440)
RBC: 4.58 MIL/uL (ref 3.80–5.20)
RDW: 13.2 % (ref 11.5–14.5)
WBC: 14.5 10*3/uL — ABNORMAL HIGH (ref 3.6–11.0)

## 2017-07-04 LAB — POCT PREGNANCY, URINE: Preg Test, Ur: NEGATIVE

## 2017-07-04 LAB — LIPASE, BLOOD: Lipase: 22 U/L (ref 11–51)

## 2017-07-04 NOTE — ED Triage Notes (Signed)
Patient with complaint of lower abdominal pain and vomiting that started this morning. Patient reports vomiting times 10 today.

## 2017-07-05 ENCOUNTER — Emergency Department: Payer: Self-pay

## 2017-07-05 ENCOUNTER — Emergency Department
Admission: EM | Admit: 2017-07-05 | Discharge: 2017-07-05 | Disposition: A | Discharge status: Home / Self Care | Payer: Self-pay | Attending: Emergency Medicine | Admitting: Emergency Medicine

## 2017-07-05 DIAGNOSIS — K529 Noninfective gastroenteritis and colitis, unspecified: Secondary | ICD-10-CM

## 2017-07-05 DIAGNOSIS — N83201 Unspecified ovarian cyst, right side: Secondary | ICD-10-CM

## 2017-07-05 MED ORDER — ONDANSETRON HCL 4 MG/2ML IJ SOLN
INTRAMUSCULAR | Status: AC
  Filled 2017-07-05: qty 2

## 2017-07-05 MED ORDER — MORPHINE SULFATE (PF) 2 MG/ML IV SOLN
INTRAVENOUS | Status: AC
  Filled 2017-07-05: qty 1

## 2017-07-05 MED ORDER — MORPHINE SULFATE (PF) 2 MG/ML IV SOLN
2.0000 mg | Freq: Once | INTRAVENOUS | Status: AC
  Administered 2017-07-05: 2 mg via INTRAVENOUS

## 2017-07-05 MED ORDER — SODIUM CHLORIDE 0.9 % IV BOLUS (SEPSIS)
1000.0000 mL | Freq: Once | INTRAVENOUS | Status: AC
  Administered 2017-07-05: 1000 mL via INTRAVENOUS

## 2017-07-05 MED ORDER — ONDANSETRON HCL 4 MG/2ML IJ SOLN
4.0000 mg | Freq: Once | INTRAMUSCULAR | Status: AC
  Administered 2017-07-05: 4 mg via INTRAVENOUS

## 2017-07-05 MED ORDER — IOPAMIDOL (ISOVUE-300) INJECTION 61%
100.0000 mL | Freq: Once | INTRAVENOUS | Status: AC | PRN
  Administered 2017-07-05: 100 mL via INTRAVENOUS

## 2017-07-05 MED ORDER — ONDANSETRON 4 MG PO TBDP: 4.0000 mg | ORAL_TABLET | Freq: Three times a day (TID) | ORAL | 0 refills | Status: DC | PRN

## 2017-07-05 NOTE — ED Notes (Signed)
Report from Amy, RN at this time.  

## 2017-07-05 NOTE — ED Provider Notes (Signed)
[facility] Emergency Department Provider Note    First MD Initiated Contact with Patient 07/05/17 0148     (approximate)  I have reviewed the triage vital signs and the nursing notes.   HISTORY  Chief Complaint Emesis and Abdominal Pain    HPI Shelby Curtis is a 31 y.o. female presents to the emergency department with lower abdominal pain that is currently 6 out of 10 that started yesterday morning accompanied by approximate 10 episodes of vomiting. Patient denies any fever afebrile on presentation. Patient admits to one episode of loose stool.   Past medical history  Patient Active Problem List   Diagnosis Date Noted  . Axillary mass, right 12/12/2016  . 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 CESAREAN SECTION  2004, 2007  . TUBAL LIGATION      Prior to Admission medications   Medication Sig Start Date End Date Taking? Authorizing Provider  EPINEPHrine 1 MG/ML KIT Inject as directed as needed.    [provider]    Allergies No known drug allergies   Family History  Problem Relation Age of Onset  . Cancer Maternal Grandmother 40       breast  . Breast cancer Maternal Grandmother     Social History Social History  Substance Use Topics  . Smoking status: Former Research scientist (life sciences)  . Smokeless tobacco: Never Used  . Alcohol use No    Review of Systems Constitutional: No fever/chills Eyes: No visual changes. ENT: No sore throat. Cardiovascular: Denies chest pain. Respiratory: Denies shortness of breath. Gastrointestinal: Positive for abdominal pain and vomiting  Genitourinary: Negative for dysuria. Musculoskeletal: Negative for neck pain.  Negative for back pain. Integumentary: Negative for rash. Neurological: Negative for headaches, focal weakness or numbness.   ____________________________________________   PHYSICAL EXAM:  VITAL SIGNS: ED Triage Vitals  Enc Vitals Group    BP 07/04/17 2210 (!) 146/90     Pulse Rate 07/04/17 2210 93     Resp 07/04/17 2210 18     Temp 07/04/17 2210 98.9 F (37.2 C)     Temp Source 07/04/17 2210 Oral     SpO2 07/04/17 2210 99 %     Weight 07/04/17 2211 86.2 kg (190 lb)     Height 07/04/17 2211 1.651 m (_0 )     Head Circumference --      Peak Flow --      Pain Score 07/04/17 2210 7     Pain Loc --      Pain Edu? --      Excl. in [place]? --     Constitutional: Alert and oriented. Well appearing and in no acute distress. Eyes: Conjunctivae are normal.  Head: Atraumatic. Mouth/Throat: Mucous membranes are moist.  Oropharynx non-erythematous. Neck: No stridor.   Cardiovascular: Normal rate, regular rhythm. Good peripheral circulation. Grossly normal heart sounds. Respiratory: Normal respiratory effort.  No retractions. Lungs CTAB. Gastrointestinal: Right lower quadrant tenderness to palpation. No distention.  Musculoskeletal: No lower extremity tenderness nor edema. No gross deformities of extremities. Neurologic:  Normal speech and language. No gross focal neurologic deficits are appreciated.  Skin:  Skin is warm, dry and intact. No rash noted. Psychiatric: Mood and affect are normal. Speech and behavior are normal.  ____________________________________________   LABS (all labs ordered are listed, but only abnormal results are displayed)  Labs Reviewed  COMPREHENSIVE METABOLIC PANEL - Abnormal; Notable for the following:  Result Value   Glucose, Bld 117 (*)    All other components within normal limits  CBC - Abnormal; Notable for the following:    WBC 14.5 (*)    All other components within normal limits  URINALYSIS, COMPLETE (UACMP) WITH MICROSCOPIC - Abnormal; Notable for the following:    Color, Urine STRAW (*)    APPearance HAZY (*)    Squamous Epithelial / LPF PRESENT (*)    Bacteria, UA FEW (*)    All other components within normal limits  LIPASE, BLOOD  POC URINE PREG, ED  POCT PREGNANCY, URINE       RADIOLOGY I, [place] Jasai Sorg, personally viewed and evaluated these images (plain radiographs) as part of my medical decision making, as well as reviewing the written report by the radiologist.  Ct Abdomen Pelvis W Contrast  Result Date: 07/05/2017 CLINICAL DATA:  Abdominal pain and vomiting. Right lower quadrant pain. Leukocytosis. EXAM: CT ABDOMEN AND PELVIS WITH CONTRAST TECHNIQUE: Multidetector CT imaging of the abdomen and pelvis was performed using the standard protocol following bolus administration of intravenous contrast. CONTRAST:  137m ISOVUE-300 IOPAMIDOL (ISOVUE-300) INJECTION 61% COMPARISON:  Head CT 05/23/2009 FINDINGS: Lower chest: Minimal hypoventilatory change at the lung bases. No consolidation or pleural fluid. Hepatobiliary: No focal hepatic lesion. Gallbladder partially distended. Possible Phrygian cap versus gallstone at the fundus. No pericholecystic inflammation. No biliary dilatation. Pancreas: No ductal dilatation or inflammation. Spleen: Normal in size without focal abnormality. Adrenals/Urinary Tract: No adrenal nodule. No hydronephrosis or perinephric edema. Homogeneous renal enhancement. Urinary bladder is physiologically distended. No bladder wall thickening. Stomach/Bowel: Stomach is within normal limits. Appendix is normal. No evidence of bowel wall thickening, distention, or inflammatory changes. Sigmoid colonic tortuosity. Vascular/Lymphatic: Normal caliber abdominal aorta. Portal vein is patent. No abdominal or pelvic adenopathy. Reproductive: Peripherally enhancing cyst in the right ovary measures 2 cm. Bilateral tubal ligation clips. Uterus is unremarkable. Trace free fluid in the pelvis is physiologic. Other: No free air, free fluid, or intra-abdominal fluid collection. Tiny fat containing umbilical hernia. Musculoskeletal: There are no acute or suspicious osseous abnormalities. IMPRESSION: 1. Peripherally enhancing 2 cm cyst in the right ovary consistent with  corpus luteum. This may be cause of patient's right lower quadrant pain. 2. Normal appendix. Electronically Signed   By: MJeb LeveringM.D.   On: 07/05/2017 02:53     Procedures   ____________________________________________   INITIAL IMPRESSION / ASSESSMENT AND PLAN / ED COURSE  Pertinent labs & imaging results that were available during my care of the patient were reviewed by me and considered in my medical decision making (see chart for details).  31year old female presenting with above stated history of physical exam. Given right lower quadrant abdominal pain CT scan performed which revealed a normal appendix and a 2 cm right ovarian cyst. Suspect patient's etiology for vomiting and loose stool secondary to infectious diarrhea however right lower quadrant pain most likely secondary to 2 cm right ovarian cyst. Patient given IV morphine and Zofran emergency Department with improvement of symptoms.      ____________________________________________  FINAL CLINICAL IMPRESSION(S) / ED DIAGNOSES  Final diagnoses:  Gastroenteritis  Right ovarian cyst     MEDICATIONS GIVEN DURING THIS VISIT:  Medications  morphine 2 MG/ML injection 2 mg (2 mg Intravenous Given 07/05/17 0207)  ondansetron (ZOFRAN) injection 4 mg (4 mg Intravenous Given 07/05/17 0207)  sodium chloride 0.9 % bolus 1,000 mL (1,000 mLs Intravenous New Bag/Given 07/05/17 0209)  iopamidol (ISOVUE-300) 61 % injection  100 mL (100 mLs Intravenous Contrast Given 07/05/17 0215)     NEW OUTPATIENT MEDICATIONS STARTED DURING THIS VISIT:  New Prescriptions   No medications on file    Modified Medications   No medications on file    Discontinued Medications   No medications on file     Note:  This document was prepared using Dragon voice recognition software and may include unintentional dictation errors.    Gregor Hams, MD 07/05/17 [phone]

## 2017-07-05 NOTE — ED Notes (Signed)
Pt ride arrived. D/c at this time in NAD, ambulatory with steady gait.

## 2017-07-05 NOTE — ED Notes (Addendum)
Pt. Verbalizes understanding of d/c instructions, prescriptions, and follow-up. VS stable and pain controlled per pt.  Pt. In NAD and denies further concerns regarding this visit.  Pt attempting to find ride at this time d/t narcotic med hold.

## 2017-07-05 NOTE — ED Notes (Addendum)
Pt reports she has sore throat , vomiting and abd pain.  Sx for 2 days.  Diarrhea x 1.  No vag bleeding   Pt denies urinary sx  No back pain.  Pt alert

## 2017-07-05 NOTE — ED Notes (Signed)
Patient transported to CT 

## 2017-07-05 NOTE — ED Notes (Signed)
Pt can not find a ride at this time. Remains in tx room at this time in NAD, watching tv

## 2017-07-05 NOTE — ED Notes (Signed)
Report off to allison rn  

## 2018-01-26 IMAGING — CR DG CHEST 2V
1 series · 2 of 2 positions shown · non-contrast
Comparison: None.

CLINICAL DATA: Cough and nausea.  Chest tightness.

EXAM:
CHEST  2 VIEW

[Series 1: w chest pa · 0.14mm/px · 2 of 2 slices shown]
[im 1/2]
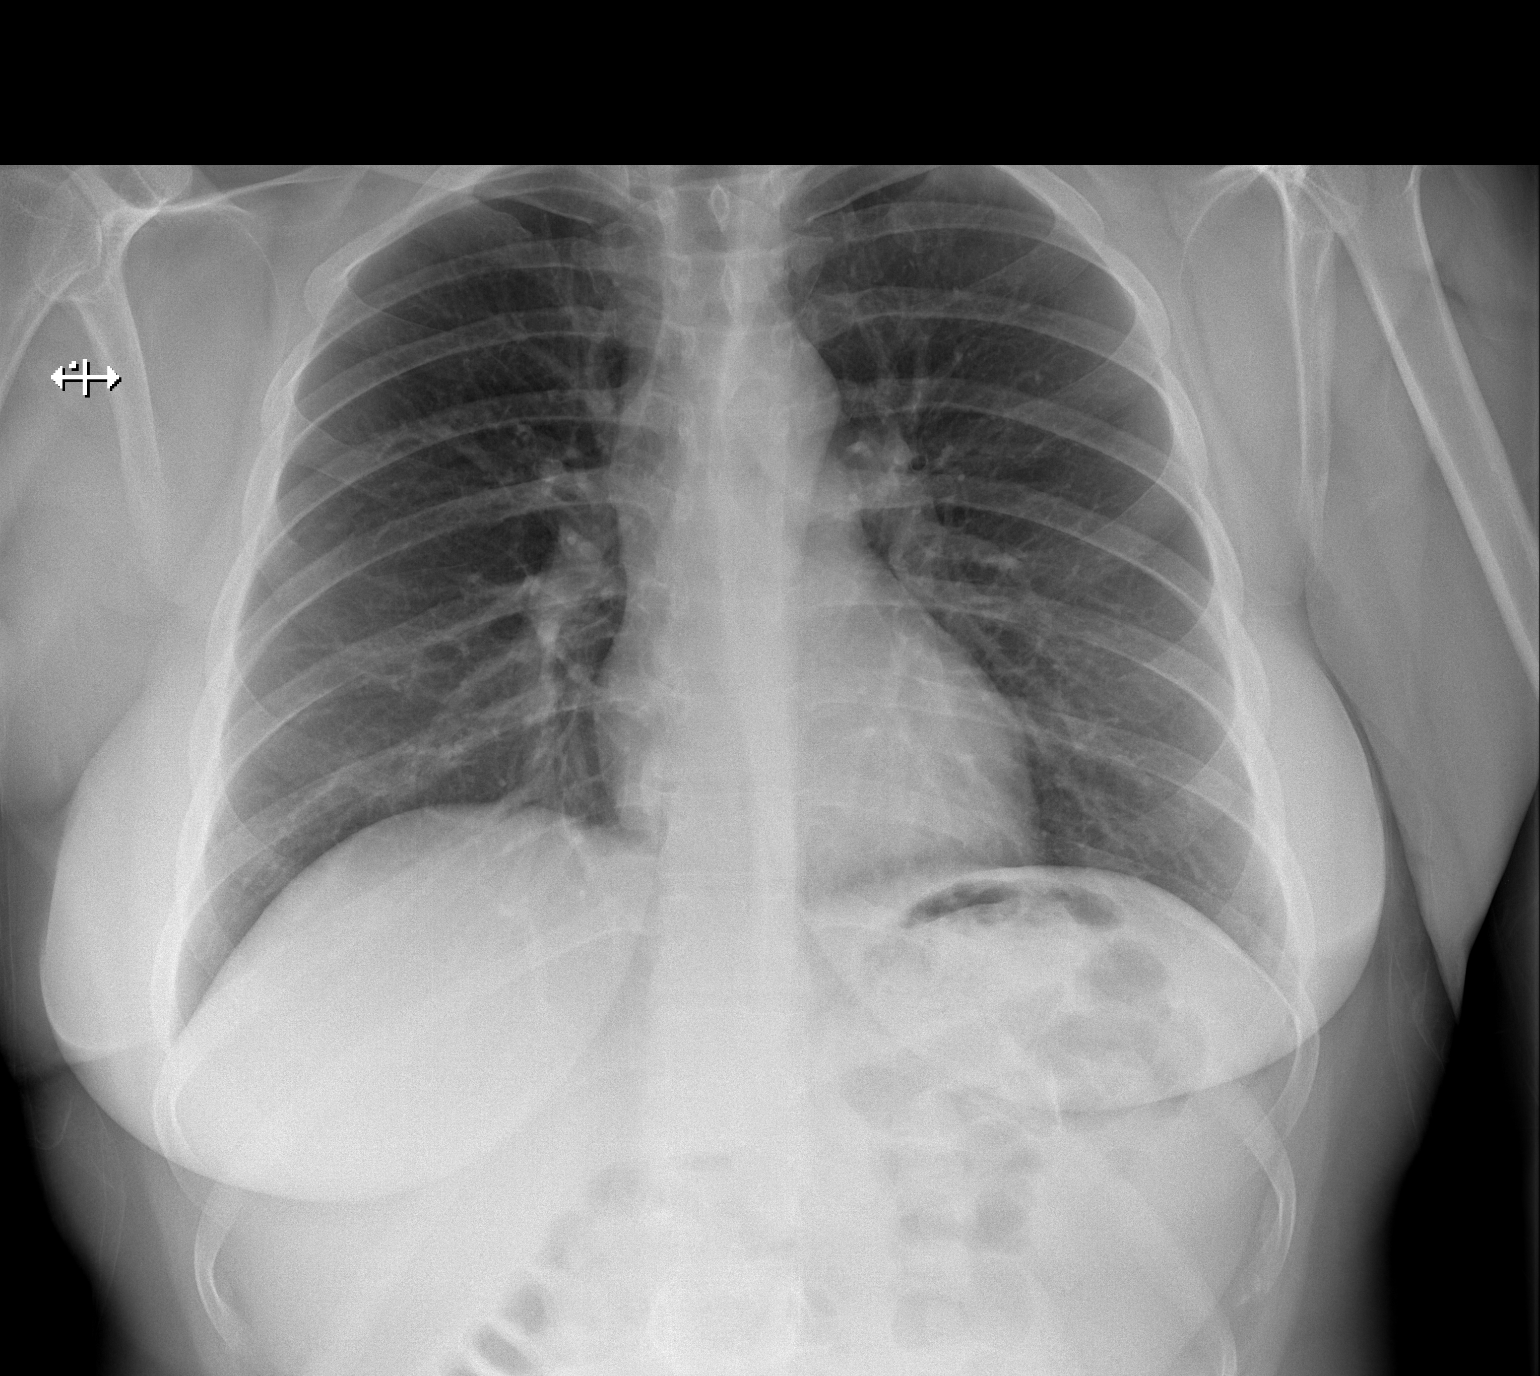
[im 2/2]
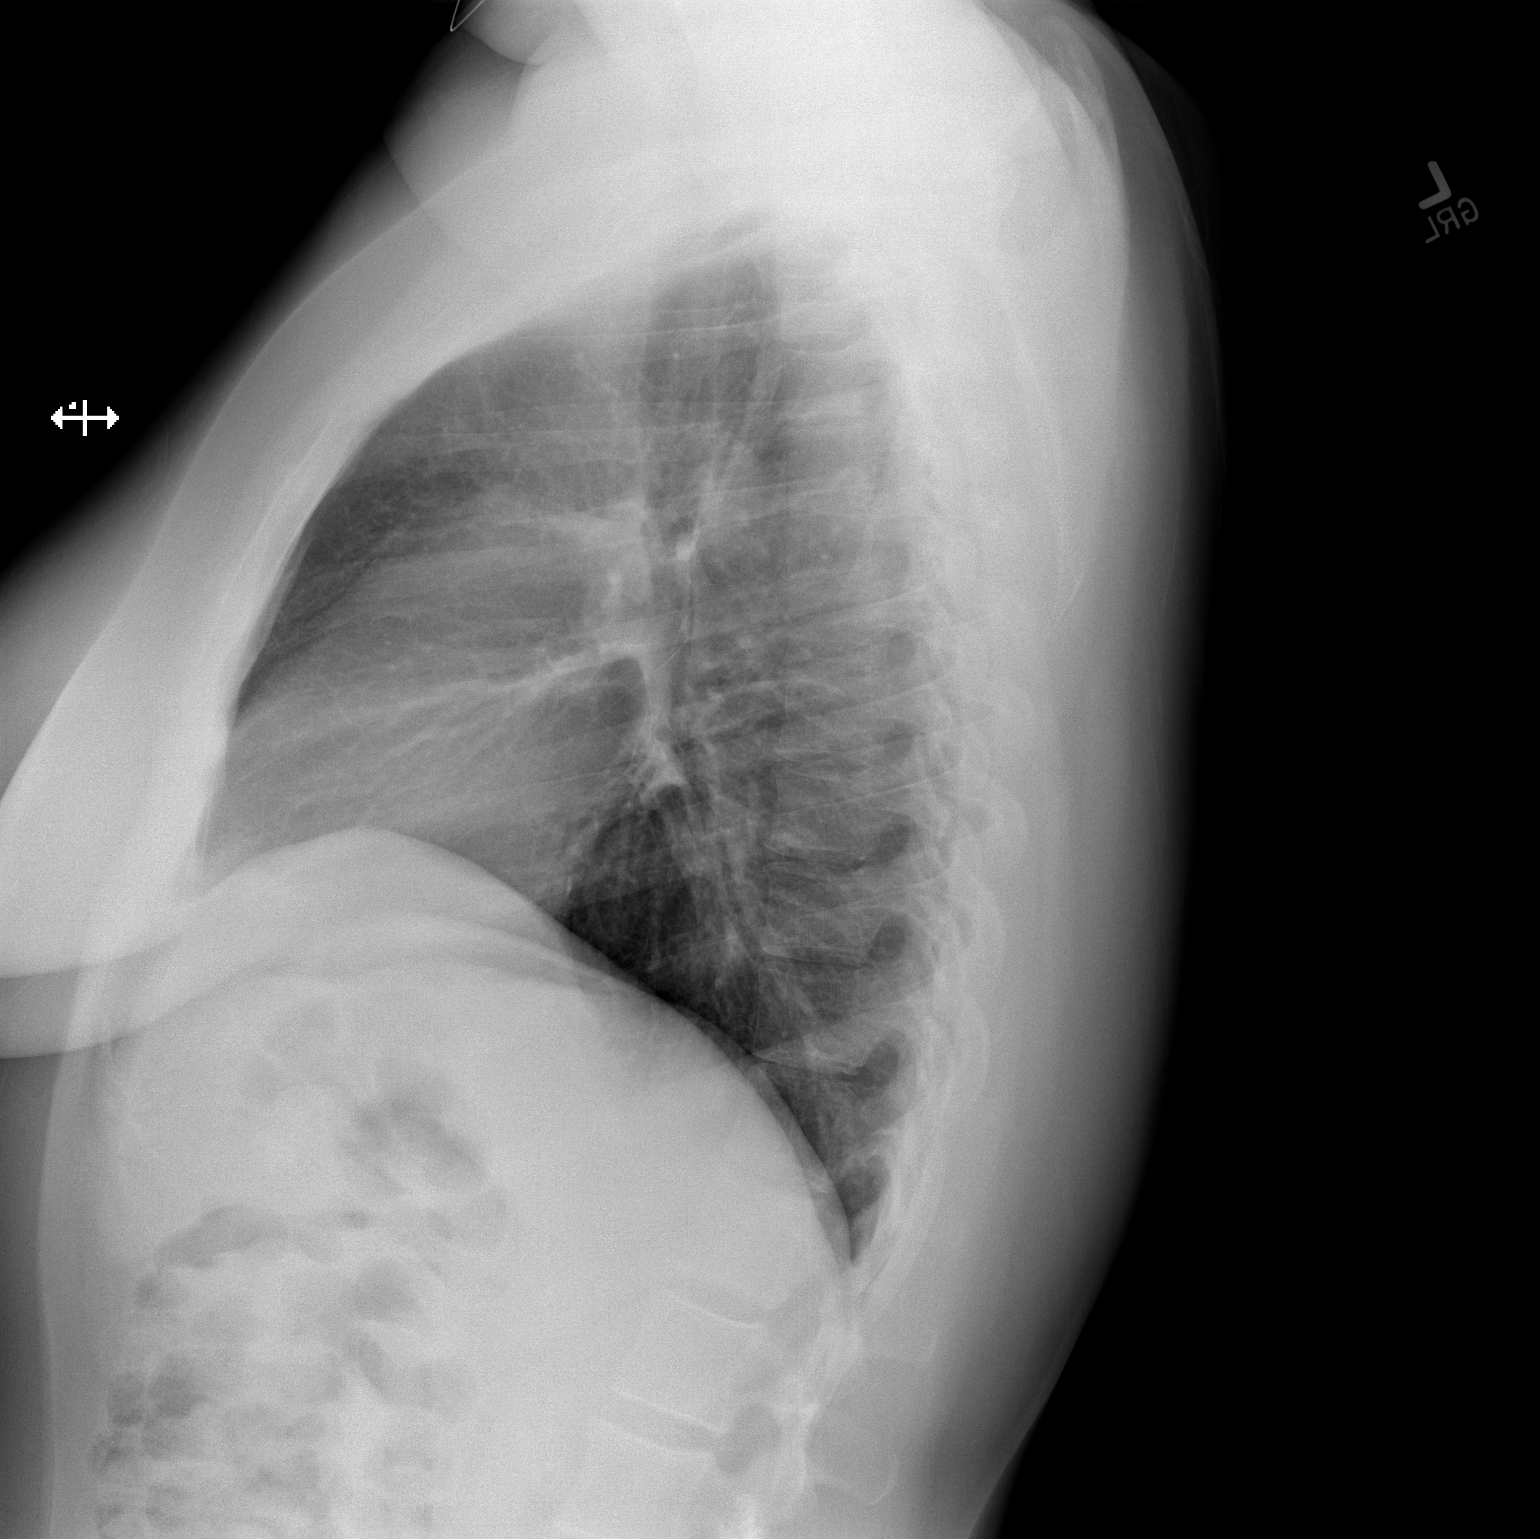

[2 of 2 positions shown; findings below may reference images not displayed]

FINDINGS: Lungs are clear. Heart size and pulmonary vascularity are normal. No
adenopathy. No bone lesions. No pneumothorax.
IMPRESSION: No abnormality noted.

## 2019-06-25 ENCOUNTER — Emergency Department
Admission: EM | Admit: 2019-06-25 | Discharge: 2019-06-25 | Disposition: A | Discharge status: Home / Self Care | Payer: Medicaid Other | Attending: Emergency Medicine | Admitting: Emergency Medicine

## 2019-06-25 ENCOUNTER — Other Ambulatory Visit: Payer: Self-pay

## 2019-06-25 DIAGNOSIS — Z3202 Encounter for pregnancy test, result negative: Secondary | ICD-10-CM | POA: Diagnosis not present

## 2019-06-25 DIAGNOSIS — Z87891 Personal history of nicotine dependence: Secondary | ICD-10-CM | POA: Insufficient documentation

## 2019-06-25 DIAGNOSIS — N939 Abnormal uterine and vaginal bleeding, unspecified: Secondary | ICD-10-CM | POA: Diagnosis not present

## 2019-06-25 LAB — URINALYSIS, COMPLETE (UACMP) WITH MICROSCOPIC
Bacteria, UA: NONE SEEN
RBC / HPF: >50 RBC/hpf — ABNORMAL HIGH (ref 0–5)
Specific Gravity, Urine: 1.025 (ref 1.005–1.030)
Squamous Epithelial / LPF: NONE SEEN (ref 0–5)
WBC, UA: >50 WBC/hpf — ABNORMAL HIGH (ref 0–5)

## 2019-06-25 LAB — BASIC METABOLIC PANEL
Anion gap: 9 (ref 5–15)
BUN: 17 mg/dL (ref 6–20)
CO2: 22 mmol/L (ref 22–32)
Calcium: 8.9 mg/dL (ref 8.9–10.3)
Chloride: 108 mmol/L (ref 98–111)
Creatinine, Ser: 0.69 mg/dL (ref 0.44–1.00)
GFR calc Af Amer: >60 mL/min (ref >60)
GFR calc non Af Amer: >60 mL/min (ref >60)
Glucose, Bld: 108 mg/dL — ABNORMAL HIGH (ref 70–99)
Potassium: 3.8 mmol/L (ref 3.5–5.1)
Sodium: 139 mmol/L (ref 135–145)

## 2019-06-25 LAB — CBC WITH DIFFERENTIAL/PLATELET
Abs Immature Granulocytes: 0.17 10*3/uL — ABNORMAL HIGH (ref 0.00–0.07)
Basophils Absolute: 0.1 10*3/uL (ref 0.0–0.1)
Basophils Relative: 1 %
Eosinophils Absolute: 0.1 10*3/uL (ref 0.0–0.5)
Eosinophils Relative: 2 %
HCT: 37.4 % (ref 36.0–46.0)
Hemoglobin: 12.1 g/dL (ref 12.0–15.0)
Immature Granulocytes: 2 %
Lymphocytes Relative: 24 %
Lymphs Abs: 2.1 10*3/uL (ref 0.7–4.0)
MCH: 26.5 pg (ref 26.0–34.0)
MCHC: 32.4 g/dL (ref 30.0–36.0)
MCV: 82 fL (ref 80.0–100.0)
Monocytes Absolute: 0.5 10*3/uL (ref 0.1–1.0)
Monocytes Relative: 6 %
Neutro Abs: 6 10*3/uL (ref 1.7–7.7)
Neutrophils Relative %: 65 %
Platelets: 289 10*3/uL (ref 150–400)
RBC: 4.56 MIL/uL (ref 3.87–5.11)
RDW: 13.2 % (ref 11.5–15.5)
WBC: 9 10*3/uL (ref 4.0–10.5)
nRBC: 0 % (ref 0.0–0.2)

## 2019-06-25 LAB — WET PREP, GENITAL
Clue Cells Wet Prep HPF POC: NONE SEEN
Sperm: NONE SEEN
Trich, Wet Prep: NONE SEEN
WBC, Wet Prep HPF POC: NONE SEEN
Yeast Wet Prep HPF POC: NONE SEEN

## 2019-06-25 LAB — POCT PREGNANCY, URINE: Preg Test, Ur: NEGATIVE

## 2019-06-25 LAB — SAMPLE TO BLOOD BANK

## 2019-06-25 NOTE — ED Notes (Addendum)
Pt states she started having vaginal bleeding 3 weeks ago, going through 6x tampons a day. Passing quarter sized clots. C/o nausea and light headedness. Hx of miscarriage.

## 2019-06-25 NOTE — ED Notes (Signed)
Pelvic exam performed with Dr. Charna Archer.

## 2019-06-25 NOTE — ED Notes (Signed)
Pt alert and oriented X4, NAD noted

## 2019-06-25 NOTE — ED Provider Notes (Signed)
[facility] Emergency Department Provider Note   ____________________________________________   First MD Initiated Contact with Patient 06/25/19 [phone]     (approximate)  I have reviewed the triage vital signs and the nursing notes.   HISTORY  Chief Complaint Vaginal Bleeding    HPI Shelby Curtis is a 33 y.o. female with no significant past medical history who presents to the ED complaining of vaginal bleeding.  Patient reports she has had approximately 3 weeks of persistent vaginal bleeding along with lower abdominal cramping.  At first she thought this was her regular period, however it has persisted longer and become more severe over the past couple of days.  She states that she has gone through about 6 pads per day over the past 3 days.  She has not had any fevers, chills, vaginal discharge, dysuria, or hematuria.  She denies having particularly heavy periods in the past and she is status post tubal ligation.  She reports feeling lightheaded at times, but denies any chest pain, shortness of breath, or syncope.        History reviewed. No pertinent past medical history.  Patient Active Problem List   Diagnosis Date Noted  . Axillary mass, right 12/12/2016  . 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 CESAREAN SECTION  2004, 2007  . TUBAL LIGATION      Prior to Admission medications   Medication Sig Start Date End Date Taking? Authorizing Provider  EPINEPHrine 1 MG/ML KIT Inject as directed as needed.    [provider]  ondansetron (ZOFRAN ODT) 4 MG disintegrating tablet Take 1 tablet (4 mg total) by mouth every 8 (eight) hours as needed for nausea or vomiting. 07/05/17   Gregor Hams, MD    Allergies Patient has no known allergies.  Family History  Problem Relation Age of Onset  . Cancer Maternal Grandmother 40       breast  . Breast cancer Maternal Grandmother     Social History  Social History   Tobacco Use  . Smoking status: Former Research scientist (life sciences)  . Smokeless tobacco: Never Used  Substance Use Topics  . Alcohol use: No    Alcohol/week: 0.0 standard drinks  . Drug use: No    Review of Systems  Constitutional: No fever/chills Eyes: No visual changes. ENT: No sore throat. Cardiovascular: Denies chest pain. Respiratory: Denies shortness of breath. Gastrointestinal: No abdominal pain.  No nausea, no vomiting.  No diarrhea.  No constipation. Genitourinary: Negative for dysuria.  Positive for vaginal bleeding. Musculoskeletal: Negative for back pain. Skin: Negative for rash. Neurological: Negative for headaches, focal weakness or numbness.  ____________________________________________   PHYSICAL EXAM:  VITAL SIGNS: ED Triage Vitals [06/25/19 0714]  Enc Vitals Group     BP (!) 115/91     Pulse Rate 96     Resp 16     Temp 98 F (36.7 C)     Temp Source Oral     SpO2 96 %     Weight 200 lb (90.7 kg)     Height _0  (1.6 m)     Head Circumference      Peak Flow      Pain Score 7     Pain Loc      Pain Edu?      Excl. in [place]?     Constitutional: Alert and oriented. Eyes: Conjunctivae are normal. Head: Atraumatic. Nose: No congestion/rhinnorhea. Mouth/Throat: Mucous membranes  are moist. Neck: Normal ROM Cardiovascular: Normal rate, regular rhythm. Grossly normal heart sounds. Respiratory: Normal respiratory effort.  No retractions. Lungs CTAB. Gastrointestinal: Soft and nontender. No distention. Genitourinary: Moderate amount of blood in vaginal vault, no cervical motion or adnexal tenderness. Musculoskeletal: No lower extremity tenderness nor edema. Neurologic:  Normal speech and language. No gross focal neurologic deficits are appreciated. Skin:  Skin is warm, dry and intact. No rash noted. Psychiatric: Mood and affect are normal. Speech and behavior are normal.  ____________________________________________   LABS (all labs ordered are  listed, but only abnormal results are displayed)  Labs Reviewed  URINALYSIS, COMPLETE (UACMP) WITH MICROSCOPIC - Abnormal; Notable for the following components:      Result Value   Color, Urine RED (*)    APPearance TURBID (*)    Glucose, UA   (*)    Value: TEST NOT REPORTED DUE TO COLOR INTERFERENCE OF URINE PIGMENT   Hgb urine dipstick   (*)    Value: TEST NOT REPORTED DUE TO COLOR INTERFERENCE OF URINE PIGMENT   Bilirubin Urine   (*)    Value: TEST NOT REPORTED DUE TO COLOR INTERFERENCE OF URINE PIGMENT   Ketones, ur   (*)    Value: TEST NOT REPORTED DUE TO COLOR INTERFERENCE OF URINE PIGMENT   Protein, ur   (*)    Value: TEST NOT REPORTED DUE TO COLOR INTERFERENCE OF URINE PIGMENT   Nitrite   (*)    Value: TEST NOT REPORTED DUE TO COLOR INTERFERENCE OF URINE PIGMENT   Leukocytes,Ua   (*)    Value: TEST NOT REPORTED DUE TO COLOR INTERFERENCE OF URINE PIGMENT   RBC / HPF >50 (*)    WBC, UA >50 (*)    All other components within normal limits  CBC WITH DIFFERENTIAL/PLATELET - Abnormal; Notable for the following components:   Abs Immature Granulocytes 0.17 (*)    All other components within normal limits  BASIC METABOLIC PANEL - Abnormal; Notable for the following components:   Glucose, Bld 108 (*)    All other components within normal limits  WET PREP, GENITAL  POC URINE PREG, ED  POCT PREGNANCY, URINE  SAMPLE TO BLOOD BANK     PROCEDURES  Procedure(s) performed (including Critical Care):  Procedures   ____________________________________________   INITIAL IMPRESSION / ASSESSMENT AND PLAN / ED COURSE       33 year old female presenting to the ED with 3 weeks of persistent vaginal bleeding, now more severe and going through approximately 6 pads per day.  Will check urine pregnancy and UA, perform pelvic exam.  No focal abdominal tenderness.  If pregnancy testing negative and H&H stable, patient would be appropriate for outpatient follow-up with her OB/GYN.   Pregnancy testing negative, pelvic exam with moderate amount of bleeding, however H&H stable.  Patient reports she does not have a gynecologist currently, will provide referral for further outpatient work-up of her vaginal bleeding.  Counseled patient to return to the ED for worsening bleeding, lightheadedness, chest pain, or shortness of breath.  Patient agrees with plan.      ____________________________________________   FINAL CLINICAL IMPRESSION(S) / ED DIAGNOSES  Final diagnoses:  Vaginal bleeding     ED Discharge Orders    None       Note:  This document was prepared using Dragon voice recognition software and may include unintentional dictation errors.   Blake Divine, MD 06/25/19 [phone]

## 2019-06-25 NOTE — ED Triage Notes (Signed)
Arrives POV c/o vaginal bleeding X 3 weeks, heavy and continuous. Reports pelvic cramping. Pt alert and oriented X4, cooperative, RR even and unlabored, color WNL. Pt in NAD.

## 2019-07-08 ENCOUNTER — Other Ambulatory Visit: Payer: Self-pay

## 2019-07-08 ENCOUNTER — Ambulatory Visit (INDEPENDENT_AMBULATORY_CARE_PROVIDER_SITE_OTHER): Payer: Self-pay | Admitting: General Surgery

## 2019-07-08 DIAGNOSIS — R2231 Localized swelling, mass and lump, right upper limb: Secondary | ICD-10-CM

## 2019-07-08 NOTE — Patient Instructions (Addendum)
We will schedule you for an MRI of the area. We will call you once we receive the results.   You are scheduled for an MRI at [facility] on Friday September 25th at 8:00 am. You will need to check in at 7:45 am at the [facility].

## 2019-07-09 ENCOUNTER — Encounter: Payer: Self-pay | Admitting: General Surgery

## 2019-07-09 NOTE — Progress Notes (Signed)
Patient ID: Shelby Curtis, female   DOB: 07/24/1986, 33 y.o.   MRN: [id number]  Chief Complaint  Patient presents with  . Other    lipoma    HPI Shelby Curtis is a 33 y.o. female.  She was seen 4 years ago by Dr. Hervey Ard, a surgeon who is no longer with this practice.  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  Mammography confirmed that there was no breast tissue involvement.  As it was asymptomatic at the time, she declined surgical intervention, selecting observation.  She was seen again in 2018, with growth noted in the mass.  Again, mammogram confirmed that there was no breast tissue involvement.  Excision was recommended.  It is unclear as to why this did not occur, there may have been some financial concerns or limitations.  She presents again today due to concerns for additional growth.  She states that most the time, it is asymptomatic but she will occasionally get a cramp type pain in her axilla.  She reports that occasionally, her right arm will go numb.  She denies any fevers or chills.  No nausea or vomiting.  No erythema, induration, or drainage from the mass.   Past Medical History:  Diagnosis Date  . Fibroid uterus   . Obesity (BMI 30-39.9)     Past Surgical History:  Procedure Laterality Date  . CESAREAN SECTION  2004, 2007  . TUBAL LIGATION      Family History  Problem Relation Age of Onset  . Cancer Maternal Grandmother 40       breast  . Breast cancer Maternal Grandmother     Social History Social History   Tobacco Use  . Smoking status: Former Research scientist (life sciences)  . Smokeless tobacco: Never Used  Substance Use Topics  . Alcohol use: No    Alcohol/week: 0.0 standard drinks  . Drug use: No    No Known Allergies  Current Outpatient Medications  Medication Sig Dispense Refill  . EPINEPHrine 1 MG/ML KIT Inject as directed as needed.     No current facility-administered medications for this visit.     Review of Systems  Review of Systems  All other systems reviewed and are negative.   Blood pressure 136/88, pulse 92, temperature 97.8 F (36.6 C), temperature source Skin, height '5\' 4"'$  (1.626 m), weight 226 lb (102.5 kg), last menstrual period 07/01/2019, SpO2 98 %.  Physical Exam Physical Exam Constitutional:      General: She is not in acute distress.    Appearance: She is obese.  HENT:     Head: Normocephalic and atraumatic.     Nose:     Comments: Covered with a mask secondary to COVID-19 precautions    Mouth/Throat:     Comments: Covered with a mask secondary to COVID-19 precautions Eyes:     General: No scleral icterus.       Right eye: No discharge.        Left eye: No discharge.     Conjunctiva/sclera: Conjunctivae normal.  Neck:     Musculoskeletal: Normal range of motion.  Cardiovascular:     Rate and Rhythm: Regular rhythm.     Pulses: Normal pulses.  Pulmonary:     Effort: Pulmonary effort is normal.     Breath sounds: Normal breath sounds.  Chest:       Comments: There is a large, well-circumscribed mass of tissue in the right axilla.  It is nontender.  There is no overlying skin change and no axillary or supraclavicular lymphadenopathy. Abdominal:     General: Bowel sounds are normal.     Palpations: Abdomen is soft.  Genitourinary:    Comments: Deferred Lymphadenopathy:     Cervical: No cervical adenopathy.  Skin:    General: Skin is warm and dry.  Neurological:     General: No focal deficit present.     Mental Status: She is alert and oriented to person, place, and time.  Psychiatric:        Mood and Affect: Mood normal.        Behavior: Behavior normal.        Thought Content: Thought content normal.     Data Reviewed I reviewed Dr. Dwyane Luo previous notes which are discussed above in the history of present illness.  I obtained additional medical history, including uterine fibroid disease from the electronic medical record.  I also reviewed imaging studies from  March 2016 and March 2018 that discuss the fact that this mass is not an ectopic breast tissue and is most compatible with a lipoma.  Assessment This is a 33 year old woman who has had an axillary mass, most consistent with a lipoma, for many years.  It has progressively grown over that time.  It is becoming increasingly symptomatic, specifically she is having numbness in her right arm.  She desires surgical resection.  Plan Due to the neurological findings, I would like to have an MRI performed to better elucidate the extent of the mass and whether or not any critical neural structures are involved.  Once I have these results, I will contact her with the plan and we will get her scheduled for surgery.    Fredirick Maudlin 07/09/2019, 5:57 PM

## 2019-07-14 DIAGNOSIS — N840 Polyp of corpus uteri: Secondary | ICD-10-CM | POA: Insufficient documentation

## 2019-07-18 ENCOUNTER — Ambulatory Visit: Admission: RE | Admit: 2019-07-18 | Payer: Medicaid Other | Source: Ambulatory Visit

## 2019-07-30 ENCOUNTER — Other Ambulatory Visit: Payer: Self-pay

## 2019-07-30 ENCOUNTER — Ambulatory Visit
Admission: RE | Admit: 2019-07-30 | Discharge: 2019-07-30 | Disposition: A | Discharge status: Home / Self Care | Payer: Medicaid Other | Source: Ambulatory Visit | Attending: General Surgery | Admitting: General Surgery

## 2019-07-30 DIAGNOSIS — R2231 Localized swelling, mass and lump, right upper limb: Secondary | ICD-10-CM | POA: Diagnosis present

## 2019-07-30 MED ORDER — GADOBUTROL 1 MMOL/ML IV SOLN
10.0000 mL | Freq: Once | INTRAVENOUS | Status: AC | PRN
  Administered 2019-07-30: 10 mL via INTRAVENOUS

## 2019-07-31 ENCOUNTER — Telehealth: Payer: Self-pay | Admitting: General Surgery

## 2019-07-31 NOTE — Telephone Encounter (Signed)
Discussed MRI results.  No nerve involvement. Will schedule for surgery.

## 2019-08-05 ENCOUNTER — Telehealth: Payer: Self-pay | Admitting: General Surgery

## 2019-08-05 NOTE — Telephone Encounter (Signed)
Pt has been advised of pre admission date/time, Covid Testing date and Surgery date.  Surgery Date: 08/20/19 with Dr Lacretia Leigh of right axillary mass.  Preadmission Testing Date: 08/13/19 between 8-1:00pm-phone interview.  Covid Testing Date: 08/15/19 between 8-10:30am - patient advised to go to the [facility]  [employer] [facility] sent via [employer] [facility] and [employer].  Patient has been made aware to call [phone], between 1-3:00pm the day before surgery, to find out what time to arrive.    All information has been mailed to patient's address-verified.

## 2019-08-07 ENCOUNTER — Other Ambulatory Visit: Payer: Self-pay | Admitting: General Surgery

## 2019-08-07 DIAGNOSIS — R2231 Localized swelling, mass and lump, right upper limb: Secondary | ICD-10-CM

## 2019-08-13 ENCOUNTER — Other Ambulatory Visit: Admission: RE | Admit: 2019-08-13 | Payer: Medicaid Other | Source: Ambulatory Visit

## 2019-08-14 ENCOUNTER — Other Ambulatory Visit: Payer: Self-pay

## 2019-08-14 ENCOUNTER — Encounter
Admission: RE | Admit: 2019-08-14 | Discharge: 2019-08-14 | Disposition: A | Discharge status: Home / Self Care | Payer: Medicaid Other | Source: Ambulatory Visit | Attending: Surgery | Admitting: Surgery

## 2019-08-14 HISTORY — DX: Localized swelling, mass and lump, unspecified upper limb: R22.30

## 2019-08-14 HISTORY — DX: Anxiety disorder, unspecified: F41.9

## 2019-08-14 HISTORY — DX: Depression, unspecified: F32.A

## 2019-08-14 NOTE — Patient Instructions (Signed)
Your procedure is scheduled on: 08/20/2019 Wed Report to Same Day Surgery 2nd floor medical mall [facility] Entrance-take elevator on left to 2nd floor.  Check in with surgery information desk.) To find out your arrival time please call [phone] between 1PM - 3PM on 08/19/2019 Tues  Remember: Instructions that are not followed completely may result in serious medical risk, up to and including death, or upon the discretion of your surgeon and anesthesiologist your surgery may need to be rescheduled.    _x___ 1. Do not eat food after midnight the night before your procedure. You may drink clear liquids up to 2 hours before you are scheduled to arrive at the hospital for your procedure.  Do not drink clear liquids within 2 hours of your scheduled arrival to the hospital.  Clear liquids include  --Water or Apple juice without pulp  --Clear carbohydrate beverage such as ClearFast or Gatorade  --Black Coffee or Clear Tea (No milk, no creamers, do not add anything to                  the coffee or Tea Type 1 and type 2 diabetics should only drink water.   ____Ensure clear carbohydrate drink on the way to the hospital for bariatric patients  ____Ensure clear carbohydrate drink 3 hours before surgery.   No gum chewing or hard candies.     __x__ 2. No Alcohol for 24 hours before or after surgery.   __x__3. No Smoking or e-cigarettes for 24 prior to surgery.  Do not use any chewable tobacco products for at least 6 hour prior to surgery   ____  4. Bring all medications with you on the day of surgery if instructed.    __x__ 5. Notify your doctor if there is any change in your medical condition     (cold, fever, infections).    x___6. On the morning of surgery brush your teeth with toothpaste and water.  You may rinse your mouth with mouth wash if you wish.  Do not swallow any toothpaste or mouthwash.   Do not wear jewelry, make-up, hairpins, clips or nail polish.  Do not wear lotions,  powders, or perfumes. You may wear deodorant.  Do not shave 48 hours prior to surgery. Men may shave face and neck.  Do not bring valuables to the hospital.    [facility] is not responsible for any belongings or valuables.               Contacts, dentures or bridgework may not be worn into surgery.  Leave your suitcase in the car. After surgery it may be brought to your room.  For patients admitted to the hospital, discharge time is determined by your                       treatment team.  _  Patients discharged the day of surgery will not be allowed to drive home.  You will need someone to drive you home and stay with you the night of your procedure.    Please read over the following fact sheets that you were given:   [facility] Preparing for Surgery and or MRSA Information   _x___ Take anti-hypertensive listed below, cardiac, seizure, asthma,     anti-reflux and psychiatric medicines. These include:  1. None  2.  3.  4.  5.  6.  ____Fleets enema or Magnesium Citrate as directed.   _x___ Use CHG Soap or  sage wipes as directed on instruction sheet   ____ Use inhalers on the day of surgery and bring to hospital day of surgery  ____ Stop Metformin and Janumet 2 days prior to surgery.    ____ Take 1/2 of usual insulin dose the night before surgery and none on the morning     surgery.   _x___ Follow recommendations from Cardiologist, Pulmonologist or PCP regarding          stopping Aspirin, Coumadin, Plavix ,Eliquis, Effient, or Pradaxa, and Pletal.  X____Stop Anti-inflammatories such as Advil, Aleve, Ibuprofen, Motrin, Naproxen, Naprosyn, Goodies powders or aspirin products. OK to take Tylenol and                          Celebrex.   _x___ Stop supplements until after surgery.  But may continue Vitamin D, Vitamin B,       and multivitamin.   ____ Bring C-Pap to the hospital.

## 2019-08-14 NOTE — Pre-Procedure Instructions (Signed)
Unable to reach patient.  Left several messages, Dr Glenford Peers office notified.

## 2019-08-15 ENCOUNTER — Other Ambulatory Visit
Admission: RE | Admit: 2019-08-15 | Discharge: 2019-08-15 | Disposition: A | Discharge status: Home / Self Care | Payer: Medicaid Other | Source: Ambulatory Visit | Attending: General Surgery | Admitting: General Surgery

## 2019-08-15 DIAGNOSIS — Z01812 Encounter for preprocedural laboratory examination: Secondary | ICD-10-CM | POA: Diagnosis present

## 2019-08-15 DIAGNOSIS — Z20828 Contact with and (suspected) exposure to other viral communicable diseases: Secondary | ICD-10-CM | POA: Insufficient documentation

## 2019-08-15 LAB — SARS CORONAVIRUS 2 (TAT 6-24 HRS): SARS Coronavirus 2: NEGATIVE

## 2019-08-20 ENCOUNTER — Encounter: Payer: Self-pay | Admitting: *Deleted

## 2019-08-20 ENCOUNTER — Ambulatory Visit: Payer: Medicaid Other | Admitting: Certified Registered Nurse Anesthetist

## 2019-08-20 ENCOUNTER — Other Ambulatory Visit: Payer: Self-pay

## 2019-08-20 ENCOUNTER — Encounter
Admission: RE | Disposition: A | Discharge status: Home / Self Care | Payer: Self-pay | Source: Home / Self Care | Attending: General Surgery

## 2019-08-20 ENCOUNTER — Observation Stay
Admission: RE | Admit: 2019-08-20 | Discharge: 2019-08-21 | Disposition: A | Discharge status: Home / Self Care | Payer: Medicaid Other | Attending: General Surgery | Admitting: General Surgery

## 2019-08-20 DIAGNOSIS — Z87891 Personal history of nicotine dependence: Secondary | ICD-10-CM | POA: Diagnosis not present

## 2019-08-20 DIAGNOSIS — R222 Localized swelling, mass and lump, trunk: Secondary | ICD-10-CM | POA: Diagnosis present

## 2019-08-20 DIAGNOSIS — Z791 Long term (current) use of non-steroidal anti-inflammatories (NSAID): Secondary | ICD-10-CM | POA: Diagnosis not present

## 2019-08-20 DIAGNOSIS — R2231 Localized swelling, mass and lump, right upper limb: Secondary | ICD-10-CM | POA: Diagnosis present

## 2019-08-20 DIAGNOSIS — Z6836 Body mass index (BMI) 36.0-36.9, adult: Secondary | ICD-10-CM | POA: Diagnosis not present

## 2019-08-20 DIAGNOSIS — F329 Major depressive disorder, single episode, unspecified: Secondary | ICD-10-CM | POA: Diagnosis not present

## 2019-08-20 DIAGNOSIS — Z803 Family history of malignant neoplasm of breast: Secondary | ICD-10-CM | POA: Insufficient documentation

## 2019-08-20 DIAGNOSIS — D1779 Benign lipomatous neoplasm of other sites: Principal | ICD-10-CM | POA: Insufficient documentation

## 2019-08-20 DIAGNOSIS — F419 Anxiety disorder, unspecified: Secondary | ICD-10-CM | POA: Diagnosis not present

## 2019-08-20 DIAGNOSIS — E669 Obesity, unspecified: Secondary | ICD-10-CM | POA: Insufficient documentation

## 2019-08-20 DIAGNOSIS — Z79899 Other long term (current) drug therapy: Secondary | ICD-10-CM | POA: Diagnosis not present

## 2019-08-20 HISTORY — PX: LIPOMA EXCISION: SHX5283

## 2019-08-20 LAB — POCT PREGNANCY, URINE: Preg Test, Ur: NEGATIVE

## 2019-08-20 LAB — HIV ANTIBODY (ROUTINE TESTING W REFLEX): HIV Screen 4th Generation wRfx: NONREACTIVE

## 2019-08-20 SURGERY — EXCISION LIPOMA
Anesthesia: General | Laterality: Right

## 2019-08-20 MED ORDER — PROPOFOL 10 MG/ML IV BOLUS
INTRAVENOUS | Status: DC | PRN
  Administered 2019-08-20: 170 mg via INTRAVENOUS

## 2019-08-20 MED ORDER — SCOPOLAMINE 1 MG/3DAYS TD PT72
MEDICATED_PATCH | TRANSDERMAL | Status: AC
  Administered 2019-08-20: 09:00:00 via TRANSDERMAL
  Filled 2019-08-20: qty 1

## 2019-08-20 MED ORDER — FENTANYL CITRATE (PF) 100 MCG/2ML IJ SOLN
INTRAMUSCULAR | Status: AC
  Filled 2019-08-20: qty 2

## 2019-08-20 MED ORDER — BUPIVACAINE HCL 0.25 % IJ SOLN
INTRAMUSCULAR | Status: DC | PRN
  Administered 2019-08-20: 30 mL

## 2019-08-20 MED ORDER — LIDOCAINE HCL (PF) 2 % IJ SOLN
INTRAMUSCULAR | Status: AC
  Filled 2019-08-20: qty 10

## 2019-08-20 MED ORDER — ROCURONIUM BROMIDE 100 MG/10ML IV SOLN
INTRAVENOUS | Status: DC | PRN
  Administered 2019-08-20: 10 mg via INTRAVENOUS
  Administered 2019-08-20: 50 mg via INTRAVENOUS
  Administered 2019-08-20: 10 mg via INTRAVENOUS

## 2019-08-20 MED ORDER — LIDOCAINE HCL (CARDIAC) PF 100 MG/5ML IV SOSY
PREFILLED_SYRINGE | INTRAVENOUS | Status: DC | PRN
  Administered 2019-08-20: 80 mg via INTRAVENOUS

## 2019-08-20 MED ORDER — METHOCARBAMOL 500 MG PO TABS
500.0000 mg | ORAL_TABLET | Freq: Four times a day (QID) | ORAL | Status: DC | PRN
  Filled 2019-08-20: qty 1

## 2019-08-20 MED ORDER — POLYETHYLENE GLYCOL 3350 17 G PO PACK: 17.0000 g | PACK | Freq: Every day | ORAL | Status: DC | PRN

## 2019-08-20 MED ORDER — BUPIVACAINE HCL (PF) 0.25 % IJ SOLN
INTRAMUSCULAR | Status: AC
  Filled 2019-08-20: qty 30

## 2019-08-20 MED ORDER — MIDAZOLAM HCL 2 MG/2ML IJ SOLN
INTRAMUSCULAR | Status: DC | PRN
  Administered 2019-08-20: 2 mg via INTRAVENOUS

## 2019-08-20 MED ORDER — CEFAZOLIN SODIUM-DEXTROSE 2-4 GM/100ML-% IV SOLN
INTRAVENOUS | Status: AC
  Filled 2019-08-20: qty 100

## 2019-08-20 MED ORDER — FENTANYL CITRATE (PF) 100 MCG/2ML IJ SOLN
INTRAMUSCULAR | Status: DC | PRN
  Administered 2019-08-20 (×2): 50 ug via INTRAVENOUS

## 2019-08-20 MED ORDER — ONDANSETRON HCL 4 MG/2ML IJ SOLN
INTRAMUSCULAR | Status: AC
  Filled 2019-08-20: qty 2

## 2019-08-20 MED ORDER — ARTIFICIAL TEARS OPHTHALMIC OINT
TOPICAL_OINTMENT | OPHTHALMIC | Status: AC
  Filled 2019-08-20: qty 3.5

## 2019-08-20 MED ORDER — FAMOTIDINE 20 MG PO TABS
ORAL_TABLET | ORAL | Status: AC
  Administered 2019-08-20: 20 mg via ORAL
  Filled 2019-08-20: qty 1

## 2019-08-20 MED ORDER — MIDAZOLAM HCL 2 MG/2ML IJ SOLN
INTRAMUSCULAR | Status: AC
  Filled 2019-08-20: qty 2

## 2019-08-20 MED ORDER — OXYCODONE HCL 5 MG PO TABS: 5.0000 mg | ORAL_TABLET | ORAL | Status: DC | PRN

## 2019-08-20 MED ORDER — DEXAMETHASONE SODIUM PHOSPHATE 10 MG/ML IJ SOLN
INTRAMUSCULAR | Status: DC | PRN
  Administered 2019-08-20: 10 mg via INTRAVENOUS

## 2019-08-20 MED ORDER — FAMOTIDINE 20 MG PO TABS
20.0000 mg | ORAL_TABLET | Freq: Once | ORAL | Status: AC
  Administered 2019-08-20: 08:00:00 20 mg via ORAL

## 2019-08-20 MED ORDER — IBUPROFEN 400 MG PO TABS: 600.0000 mg | ORAL_TABLET | Freq: Four times a day (QID) | ORAL | Status: DC | PRN

## 2019-08-20 MED ORDER — CEFAZOLIN SODIUM-DEXTROSE 2-4 GM/100ML-% IV SOLN
2.0000 g | INTRAVENOUS | Status: AC
  Administered 2019-08-20: 2 g via INTRAVENOUS

## 2019-08-20 MED ORDER — LIDOCAINE-EPINEPHRINE 1 %-1:100000 IJ SOLN
INTRAMUSCULAR | Status: DC | PRN
  Administered 2019-08-20: 20 mL

## 2019-08-20 MED ORDER — CHLORHEXIDINE GLUCONATE CLOTH 2 % EX PADS: 6.0000 | MEDICATED_PAD | Freq: Once | CUTANEOUS | Status: DC

## 2019-08-20 MED ORDER — HYDROMORPHONE HCL 1 MG/ML IJ SOLN: 0.5000 mg | INTRAMUSCULAR | Status: DC | PRN

## 2019-08-20 MED ORDER — SUCCINYLCHOLINE CHLORIDE 20 MG/ML IJ SOLN
INTRAMUSCULAR | Status: DC | PRN
  Administered 2019-08-20: 100 mg via INTRAVENOUS

## 2019-08-20 MED ORDER — ONDANSETRON HCL 4 MG/2ML IJ SOLN
INTRAMUSCULAR | Status: DC | PRN
  Administered 2019-08-20: 4 mg via INTRAVENOUS

## 2019-08-20 MED ORDER — LIDOCAINE-EPINEPHRINE 1 %-1:100000 IJ SOLN
INTRAMUSCULAR | Status: AC
  Filled 2019-08-20: qty 1

## 2019-08-20 MED ORDER — DEXAMETHASONE SODIUM PHOSPHATE 10 MG/ML IJ SOLN
INTRAMUSCULAR | Status: AC
  Filled 2019-08-20: qty 1

## 2019-08-20 MED ORDER — OXYCODONE HCL 5 MG PO TABS: 5.0000 mg | ORAL_TABLET | Freq: Once | ORAL | Status: DC | PRN

## 2019-08-20 MED ORDER — ONDANSETRON HCL 4 MG/2ML IJ SOLN: 4.0000 mg | Freq: Four times a day (QID) | INTRAMUSCULAR | Status: DC | PRN

## 2019-08-20 MED ORDER — PHENYLEPHRINE HCL (PRESSORS) 10 MG/ML IV SOLN
INTRAVENOUS | Status: DC | PRN
  Administered 2019-08-20 (×2): 100 ug via INTRAVENOUS

## 2019-08-20 MED ORDER — BUPIVACAINE LIPOSOME 1.3 % IJ SUSP
INTRAMUSCULAR | Status: AC
  Filled 2019-08-20: qty 20

## 2019-08-20 MED ORDER — ACETAMINOPHEN 500 MG PO TABS
ORAL_TABLET | ORAL | Status: AC
  Administered 2019-08-20: 1000 mg via ORAL
  Filled 2019-08-20: qty 2

## 2019-08-20 MED ORDER — ROCURONIUM BROMIDE 50 MG/5ML IV SOLN
INTRAVENOUS | Status: AC
  Filled 2019-08-20: qty 1

## 2019-08-20 MED ORDER — ACETAMINOPHEN 500 MG PO TABS
1000.0000 mg | ORAL_TABLET | Freq: Four times a day (QID) | ORAL | Status: DC
  Administered 2019-08-20 – 2019-08-21 (×3): 1000 mg via ORAL
  Filled 2019-08-20 (×3): qty 2

## 2019-08-20 MED ORDER — SUGAMMADEX SODIUM 200 MG/2ML IV SOLN
INTRAVENOUS | Status: AC
  Filled 2019-08-20: qty 2

## 2019-08-20 MED ORDER — BUPIVACAINE LIPOSOME 1.3 % IJ SUSP: 20.0000 mL | Freq: Once | INTRAMUSCULAR | Status: DC

## 2019-08-20 MED ORDER — FENTANYL CITRATE (PF) 100 MCG/2ML IJ SOLN
INTRAMUSCULAR | Status: AC
  Administered 2019-08-20: 25 ug via INTRAVENOUS
  Filled 2019-08-20: qty 2

## 2019-08-20 MED ORDER — ONDANSETRON 4 MG PO TBDP: 4.0000 mg | ORAL_TABLET | Freq: Four times a day (QID) | ORAL | Status: DC | PRN

## 2019-08-20 MED ORDER — OXYCODONE HCL 5 MG/5ML PO SOLN: 5.0000 mg | Freq: Once | ORAL | Status: DC | PRN

## 2019-08-20 MED ORDER — LACTATED RINGERS IV SOLN
INTRAVENOUS | Status: DC
  Administered 2019-08-20 – 2019-08-21 (×2): via INTRAVENOUS

## 2019-08-20 MED ORDER — LACTATED RINGERS IV SOLN
INTRAVENOUS | Status: DC
  Administered 2019-08-20: 09:00:00 via INTRAVENOUS

## 2019-08-20 MED ORDER — ACETAMINOPHEN 500 MG PO TABS
1000.0000 mg | ORAL_TABLET | ORAL | Status: AC
  Administered 2019-08-20: 08:00:00 1000 mg via ORAL

## 2019-08-20 MED ORDER — KETOROLAC TROMETHAMINE 30 MG/ML IJ SOLN
30.0000 mg | Freq: Four times a day (QID) | INTRAMUSCULAR | Status: DC | PRN
  Administered 2019-08-20 – 2019-08-21 (×2): 30 mg via INTRAVENOUS
  Filled 2019-08-20 (×2): qty 1

## 2019-08-20 MED ORDER — SODIUM CHLORIDE 0.9 % IV SOLN
INTRAVENOUS | Status: DC | PRN
  Administered 2019-08-20: 70 mL

## 2019-08-20 MED ORDER — FENTANYL CITRATE (PF) 100 MCG/2ML IJ SOLN
25.0000 ug | INTRAMUSCULAR | Status: DC | PRN
  Administered 2019-08-20 (×2): 25 ug via INTRAVENOUS

## 2019-08-20 MED ORDER — SUCCINYLCHOLINE CHLORIDE 20 MG/ML IJ SOLN
INTRAMUSCULAR | Status: AC
  Filled 2019-08-20: qty 1

## 2019-08-20 MED ORDER — SODIUM CHLORIDE (PF) 0.9 % IJ SOLN
INTRAMUSCULAR | Status: AC
  Filled 2019-08-20: qty 50

## 2019-08-20 MED ORDER — SUGAMMADEX SODIUM 200 MG/2ML IV SOLN
INTRAVENOUS | Status: DC | PRN
  Administered 2019-08-20: 200 mg via INTRAVENOUS

## 2019-08-20 MED ORDER — PROPOFOL 10 MG/ML IV BOLUS
INTRAVENOUS | Status: AC
  Filled 2019-08-20: qty 20

## 2019-08-20 SURGICAL SUPPLY — 43 items
BACTOSHIELD CHG 4% 4OZ (MISCELLANEOUS)
BLADE SURG 15 STRL LF DISP TIS (BLADE) ×1 IMPLANT
BLADE SURG 15 STRL SS (BLADE) ×1
BULB RESERV EVAC DRAIN JP 100C (MISCELLANEOUS) ×2 IMPLANT
CANISTER SUCT 1200ML W/VALVE (MISCELLANEOUS) ×2 IMPLANT
CHLORAPREP W/TINT 26 (MISCELLANEOUS) ×2 IMPLANT
CLIP VESOCCLUDE MED 6/CT (CLIP) ×2 IMPLANT
COVER WAND RF STERILE (DRAPES) IMPLANT
DECANTER SPIKE VIAL GLASS SM (MISCELLANEOUS) ×2 IMPLANT
DERMABOND ADVANCED (GAUZE/BANDAGES/DRESSINGS) ×2
DERMABOND ADVANCED .7 DNX12 (GAUZE/BANDAGES/DRESSINGS) ×2 IMPLANT
DRAIN CHANNEL JP 19F (MISCELLANEOUS) ×2 IMPLANT
DRAPE LAPAROTOMY 77X122 PED (DRAPES) IMPLANT
DRAPE MAG INST 16X20 L/F (DRAPES) ×2 IMPLANT
DRSG GAUZE FLUFF 36X18 (GAUZE/BANDAGES/DRESSINGS) ×2 IMPLANT
ELECT CAUTERY BLADE TIP 2.5 (TIP) ×2
ELECT REM PT RETURN 9FT ADLT (ELECTROSURGICAL) ×2
ELECTRODE CAUTERY BLDE TIP 2.5 (TIP) ×1 IMPLANT
ELECTRODE REM PT RTRN 9FT ADLT (ELECTROSURGICAL) ×1 IMPLANT
GLOVE BIO SURGEON STRL SZ 6.5 (GLOVE) ×2 IMPLANT
GLOVE INDICATOR 7.0 STRL GRN (GLOVE) ×2 IMPLANT
GOWN STRL REUS W/ TWL LRG LVL3 (GOWN DISPOSABLE) ×2 IMPLANT
GOWN STRL REUS W/TWL LRG LVL3 (GOWN DISPOSABLE) ×2
NEEDLE HYPO 22GX1.5 SAFETY (NEEDLE) ×2 IMPLANT
NEEDLE HYPO 25X1 1.5 SAFETY (NEEDLE) ×2 IMPLANT
NS IRRIG 500ML POUR BTL (IV SOLUTION) ×2 IMPLANT
PACK BASIN MINOR ARMC (MISCELLANEOUS) ×2 IMPLANT
SCRUB CHG 4% DYNA-HEX 4OZ (MISCELLANEOUS) IMPLANT
SPONGE DRAIN TRACH 4X4 STRL 2S (GAUZE/BANDAGES/DRESSINGS) ×2 IMPLANT
SPONGE LAP 18X18 RF (DISPOSABLE) ×4 IMPLANT
STRIP CLOSURE SKIN 1/2X4 (GAUZE/BANDAGES/DRESSINGS) IMPLANT
SUT ETHILON 3-0 FS-10 30 BLK (SUTURE) ×2
SUT MNCRL 4-0 (SUTURE) ×2
SUT MNCRL 4-0 27XMFL (SUTURE) ×2
SUT SILK 2 0 PERMA HAND 18 BK (SUTURE) ×2 IMPLANT
SUT VIC AB 2-0 SH 27 (SUTURE) ×1
SUT VIC AB 2-0 SH 27XBRD (SUTURE) ×1 IMPLANT
SUT VIC AB 3-0 SH 27 (SUTURE) ×2
SUT VIC AB 3-0 SH 27X BRD (SUTURE) ×2 IMPLANT
SUTURE EHLN 3-0 FS-10 30 BLK (SUTURE) ×1 IMPLANT
SUTURE MNCRL 4-0 27XMF (SUTURE) ×2 IMPLANT
SYR 10ML LL (SYRINGE) ×2 IMPLANT
SYR BULB IRRIG 60ML STRL (SYRINGE) ×2 IMPLANT

## 2019-08-20 NOTE — Anesthesia Preprocedure Evaluation (Signed)
Anesthesia Evaluation  Patient identified by MRN, date of birth, ID band Patient awake    Reviewed: Allergy & Precautions, H&P , NPO status , Patient's Chart, lab work & pertinent test results  Airway Mallampati: III  TM Distance: >3 FB Neck ROM: full    Dental  (+) Chipped   Pulmonary neg COPD, former smoker,           Cardiovascular (-) angina(-) Past MI and (-) Cardiac Stents negative cardio ROS  (-) dysrhythmias      Neuro/Psych negative neurological ROS  negative psych ROS   GI/Hepatic negative GI ROS, Neg liver ROS,   Endo/Other  negative endocrine ROS  Renal/GU      Musculoskeletal   Abdominal   Peds  Hematology negative hematology ROS (+)   Anesthesia Other Findings Past Medical History: No date: Anxiety No date: Axillary mass No date: Depression No date: Fibroid uterus No date: Obesity (BMI 30-39.9)  Past Surgical History: 2004, 2007: CESAREAN SECTION No date: TUBAL LIGATION  BMI    Body Mass Index: 36.05 kg/m      Reproductive/Obstetrics negative OB ROS                             Anesthesia Physical Anesthesia Plan  ASA: II  Anesthesia Plan: General ETT   Post-op Pain Management:    Induction:   PONV Risk Score and Plan: Ondansetron, Dexamethasone, Midazolam and Treatment may vary due to age or medical condition  Airway Management Planned:   Additional Equipment:   Intra-op Plan:   Post-operative Plan:   Informed Consent: I have reviewed the patients History and Physical, chart, labs and discussed the procedure including the risks, benefits and alternatives for the proposed anesthesia with the patient or authorized representative who has indicated his/her understanding and acceptance.     Dental Advisory Given  Plan Discussed with: Anesthesiologist, CRNA and Surgeon  Anesthesia Plan Comments:         Anesthesia Quick Evaluation

## 2019-08-20 NOTE — Transfer of Care (Signed)
Immediate Anesthesia Transfer of Care Note  Patient: Shelby Curtis  Procedure(s) Performed: EXCISION LIPOMA-excision of right axillary mass (Right )  Patient Location: PACU  Anesthesia Type:General  Level of Consciousness: drowsy  Airway & Oxygen Therapy: Patient Spontanous Breathing and Patient connected to face mask oxygen  Post-op Assessment: Report given to RN and Post -op Vital signs reviewed and stable  Post vital signs: Reviewed and stable  Last Vitals:  Vitals Value Taken Time  BP 116/70 08/20/19 1133  Temp 36.4 C 08/20/19 1131  Pulse 80 08/20/19 1140  Resp    SpO2 100 % 08/20/19 1140  Vitals shown include unvalidated device data.  Last Pain:  Vitals:   08/20/19 1131  TempSrc:   PainSc: 0-No pain         Complications: No apparent anesthesia complications

## 2019-08-20 NOTE — Anesthesia Post-op Follow-up Note (Signed)
Anesthesia QCDR form completed.        

## 2019-08-20 NOTE — Anesthesia Postprocedure Evaluation (Signed)
Anesthesia Post Note  Patient: Shelby Curtis  Procedure(s) Performed: EXCISION LIPOMA-excision of right axillary mass (Right )  Patient location during evaluation: PACU Anesthesia Type: General Level of consciousness: awake and alert Pain management: pain level controlled Vital Signs Assessment: post-procedure vital signs reviewed and stable Respiratory status: spontaneous breathing, nonlabored ventilation and respiratory function stable Cardiovascular status: blood pressure returned to baseline and stable Postop Assessment: no apparent nausea or vomiting Anesthetic complications: no     Last Vitals:  Vitals:   08/20/19 1313 08/20/19 1430  BP: 130/77 115/75  Pulse: 84 85  Resp: 18 18  Temp: 36.8 C 36.4 C  SpO2: 100% 99%    Last Pain:  Vitals:   08/20/19 1436  TempSrc:   PainSc: [place]

## 2019-08-20 NOTE — Anesthesia Procedure Notes (Signed)
Procedure Name: Intubation Date/Time: 08/20/2019 9:21 AM Performed by: Rudean Hitt, CRNA Pre-anesthesia Checklist: Patient identified, Patient being monitored, Timeout performed, Emergency Drugs available and Suction available Patient Re-evaluated:Patient Re-evaluated prior to induction Oxygen Delivery Method: Circle system utilized Preoxygenation: Pre-oxygenation with 100% oxygen Induction Type: IV induction Ventilation: Mask ventilation without difficulty Laryngoscope Size: Mac and 3 Grade View: Grade II Tube type: Oral Tube size: 7.0 mm Number of attempts: 1 Airway Equipment and Method: Stylet Placement Confirmation: ETT inserted through vocal cords under direct vision,  positive ETCO2 and breath sounds checked- equal and bilateral Secured at: 21 cm Tube secured with: Tape Dental Injury: Teeth and Oropharynx as per pre-operative assessment

## 2019-08-20 NOTE — Progress Notes (Signed)
Pt received JP drain education from RN. Verbalized understanding.

## 2019-08-20 NOTE — Op Note (Signed)
Operative Note  Preoperative Diagnosis: Giant subfascial axillary mass  Postoperative Diagnosis: Same  Operation: Radical resection of giant axillary mass (greater then 20 cm)  Surgeon: Fredirick Maudlin, MD  Assistant: None  Anesthesia: General endotracheal  Findings: The axillary mass was a huge fatty tumor that extended underneath the pectoralis minor and abutted the axillary vein and artery.  There was no obvious invasion of surrounding tissues or structures.  Indications: Shelby Curtis is a 33 year old woman who has had progressive enlargement of a mass in her axilla.  It is causing her discomfort and she desired surgical resection.  MRI was consistent with a fatty tumor, without involvement of neural structures, but a liposarcoma could not be ruled out.  Procedure In Detail: The patient was identified in the preoperative holding area and brought to the operating room where she was placed supine on the OR table.  All bony prominences were padded and bilateral sequential compression devices were placed on the lower extremities.  General endotracheal anesthesia was induced without incident.  The patient was then turned into a left lateral decubitus position.  Once again care was taken to appropriately pad all bony prominences.  Her body was supported on large gel roll.  Due to the location of the tumor, her right arm was suspended and supported on a heavily padded Mayo stand.  Soft tissue was taped to provide adequate exposure to the mass.  She was then sterilely prepped and draped in standard fashion.  A timeout was performed confirming the patient's identity, the procedure being performed, her allergies, all necessary equipment was available, and that maintenance anesthesia was adequate.  A perioperative dose of antibiotics was administered.  The skin overlying the tumor was infiltrated with a one-to-one mixture of 0.25% bupivacaine mixed with 1% lidocaine with epinephrine.  The skin was then  sharply incised.  The dissection was carried down through the subcutaneous tissues using electrocautery.  The margins of the mass were palpated.  Using a combination of blunt dissection and electrocautery.  It was carefully dissected out from underneath the overlying muscle and fascia.  It was very well-circumscribed but extremely large.  Careful circumferential dissection was performed.  It was found to pass underneath the pectoralis minor.  As it was dissected off the axillary basin, the axillary vein and artery were exposed along with the long thoracic nerve.  Great care was taken to preserve the structures.  Small blood vessels were controlled with electrocautery.  The mass was completely excised.  It was marked with sutures for orientation.  The wound bed was irrigated thoroughly and good hemostasis achieved.  Due to the concern for potential low-grade liposarcoma, ligaclips were placed in the wound bed to mark the margins, should external beam radiation be indicated.  The specimen was then handed off.  Liposomal bupivacaine was infiltrated throughout the soft tissue surrounding the wound.  A #19 Blake drain was brought out through a separate stab incision and sutured in place with nylon.  The incision was then closed in layers.  The deep soft tissue layers were closed with interrupted 2-0 Vicryl.  The dermis was reapproximated with interrupted 3-0 Vicryl.  The skin was closed with running subcuticular Monocryl.  The skin was cleaned and Dermabond was applied.  A drain sponge and sterile dressing were secured.  Gauze fluffs and a breast binder were used to apply a pressure dressing to the site.  The patient was then rolled supine.  She was awakened, extubated, and taken to the postanesthesia care  unit in good condition.  EBL: Less than 10 cc  IVF: See anesthesia record  Specimen(s): Right axillary mass.  Short stitch marks the superior margin, long stitch marks the lateral margin, and double stitch marks  the deep margin.  Complications: none immediately apparent.   Counts: all needles, instruments, and sponges were counted and reported to be correct in number at the end of the case.   I was present for and participated in the entire operation.  Fredirick Maudlin 11:46 AM

## 2019-08-20 NOTE — H&P (Signed)
HPI Shelby Curtis is a 33 y.o. female.  She was seen 35 years ago by Dr. Hervey Ard, a surgeon who is no longer with this practice.  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  Mammography confirmed that there was no breast tissue involvement.  As it was asymptomatic at the time, she declined surgical intervention, selecting observation.  She was seen again in 2018, with growth noted in the mass.  Again, mammogram confirmed that there was no breast tissue involvement.  Excision was recommended.  It is unclear as to why this did not occur, there may have been some financial concerns or limitations.  She presents again today due to concerns for additional growth.  She states that most the time, it is asymptomatic but she will occasionally get a cramp type pain in her axilla.  She reports that occasionally, her right arm will go numb.  She denies any fevers or chills.  No nausea or vomiting.  No erythema, induration, or drainage from the mass.       Past Medical History:  Diagnosis Date  . Fibroid uterus   . Obesity (BMI 30-39.9)          Past Surgical History:  Procedure Laterality Date  . CESAREAN SECTION  2004, 2007  . TUBAL LIGATION      Family History  Problem Relation Age of Onset  . Cancer Maternal Grandmother 40       breast  . Breast cancer Maternal Grandmother     Social History Social History        Tobacco Use  . Smoking status: Former Research scientist (life sciences)  . Smokeless tobacco: Never Used  Substance Use Topics  . Alcohol use: No    Alcohol/week: 0.0 standard drinks  . Drug use: No    No Known Allergies        Current Outpatient Medications  Medication Sig Dispense Refill  . EPINEPHrine 1 MG/ML KIT Inject as directed as needed.     No current facility-administered medications for this visit.     Review of Systems Review of Systems  All other systems reviewed and are negative.   Today's Vitals   08/20/19  0821  BP: 116/81  Pulse: 89  Resp: 18  Temp: 97.7 F (36.5 C)  TempSrc: Temporal  SpO2: 97%  Weight: 95.3 kg  Height: _0  (1.626 m)  PainSc: 0-No pain   Body mass index is 36.05 kg/m.   Physical Exam Physical Exam Constitutional:      General: She is not in acute distress.    Appearance: She is obese.  HENT:     Head: Normocephalic and atraumatic.     Nose:     Comments: Covered with a mask secondary to COVID-19 precautions    Mouth/Throat:     Comments: Covered with a mask secondary to COVID-19 precautions Eyes:     General: No scleral icterus.       Right eye: No discharge.        Left eye: No discharge.     Conjunctiva/sclera: Conjunctivae normal.  Neck:     Musculoskeletal: Normal range of motion.  Cardiovascular:     Rate and Rhythm: Regular rhythm.     Pulses: Normal pulses.  Pulmonary:     Effort: Pulmonary effort is normal.     Breath sounds: Normal breath sounds.  Chest:       Comments: There is a large, well-circumscribed mass of tissue in  the right axilla.  It is nontender.  There is no overlying skin change and no axillary or supraclavicular lymphadenopathy. Abdominal:     General: Bowel sounds are normal.     Palpations: Abdomen is soft.  Genitourinary:    Comments: Deferred Lymphadenopathy:     Cervical: No cervical adenopathy.  Skin:    General: Skin is warm and dry.  Neurological:     General: No focal deficit present.     Mental Status: She is alert and oriented to person, place, and time.  Psychiatric:        Mood and Affect: Mood normal.        Behavior: Behavior normal.        Thought Content: Thought content normal.     Data Reviewed I reviewed Dr. Dwyane Luo previous notes which are discussed above in the history of present illness.  I obtained additional medical history, including uterine fibroid disease from the electronic medical record.  I also reviewed imaging studies from March 2016 and March 2018 that discuss the fact  that this mass is not an ectopic breast tissue and is most compatible with a lipoma.  Assessment This is a 33 year old woman who has had an axillary mass, most consistent with a lipoma, for many years.  It has progressively grown over that time.  It is becoming increasingly symptomatic, specifically she is having numbness in her right arm.  She desires surgical resection.  Plan Due to the neurological findings, I had an MRI performed that did not demonstrate any neural involvement.  We will proceed with surgical removal today.

## 2019-08-21 DIAGNOSIS — D1779 Benign lipomatous neoplasm of other sites: Secondary | ICD-10-CM | POA: Diagnosis not present

## 2019-08-21 LAB — SURGICAL PATHOLOGY

## 2019-08-21 MED ORDER — IBUPROFEN 600 MG PO TABS: 600.0000 mg | ORAL_TABLET | Freq: Four times a day (QID) | ORAL | 0 refills | Status: DC | PRN

## 2019-08-21 MED ORDER — OXYCODONE HCL 5 MG PO TABS: 5.0000 mg | ORAL_TABLET | ORAL | 0 refills | Status: DC | PRN

## 2019-08-21 MED ORDER — METHOCARBAMOL 500 MG PO TABS: 500.0000 mg | ORAL_TABLET | Freq: Four times a day (QID) | ORAL | 0 refills | Status: DC | PRN

## 2019-08-21 NOTE — Discharge Summary (Addendum)
[facility] SURGICAL DISCHARGE SUMMARY  Patient ID: Shelby Curtis MRN: [id number] DOB/AGE: Jan 24, 1986 32 y.o.  Admit date: 08/20/2019 Discharge date: 08/21/2019  Discharge Diagnoses Patient Active Problem List   Diagnosis Date Noted  . Axillary mass, right 12/12/2016  . 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  It is causing her discomfort and she desired surgical resection.  MRI was consistent with a fatty tumor, without involvement of neural structures, but a liposarcoma could not be ruled out. She presents to [facility] on 10/28 for scheduled excision with Dr Celine Ahr.   Hospital Course: Informed consent was obtained and documented, and patient underwent uneventful Radical resection of giant axillary mass, greater then 20 cm (Dr Celine Ahr, 08/20/2019).  Post-operatively, patient's pain improved/resolved and advancement of patient's diet and ambulation were well-tolerated. The remainder of patient's hospital course was essentially unremarkable, and discharge planning was initiated accordingly with patient safely able to be discharged home with appropriate discharge instructions, drain teaching, pain control, and outpatient follow-up after all of her questions were answered to her expressed satisfaction.   Discharge Condition: Good   Physical Examination:  Constitutional: Well appearing female, NAD Pulmonary: Normal effort, no respiratory distress MSK/Neuro: ROM in RUE intact, sensation intact, strength intact Skin: Dressing to right axilla, no drainage, JP in place with serosanguinous output    Allergies as of 08/21/2019   No Known Allergies     Medication List    TAKE these medications   ibuprofen 600 MG tablet Commonly known as: ADVIL Take 1 tablet (600 mg total) by  mouth every 6 (six) hours as needed for mild pain.   methocarbamol 500 MG tablet Commonly known as: ROBAXIN Take 1 tablet (500 mg total) by mouth every 6 (six) hours as needed for muscle spasms.   oxyCODONE 5 MG immediate release tablet Commonly known as: Oxy IR/ROXICODONE Take 1 tablet (5 mg total) by mouth every 4 (four) hours as needed for severe pain or breakthrough pain.        Follow-up Information    Fredirick Maudlin, MD. Go on 08/28/2019.   Specialty: General Surgery Why: s/p left axilla mass excision 9:45 am appointment Contact information: [address] [place] [address] [phone]            Time spent on discharge management including discussion of hospital course, clinical condition, outpatient instructions, prescriptions, and follow up with the patient and members of the medical team: >30 minutes  -- Edison Simon , PA-C [facility]  08/21/2019, 10:07 AM [phone] M-F: 7am - 4pm  I saw and evaluated the patient.  I agree with the above documentation, exam, and plan, which I have edited where appropriate. Fredirick Maudlin  10:11 AM

## 2019-08-21 NOTE — Progress Notes (Signed)
Discharge order received. Patient mental status is at baseline. Vital signs stable . No signs of acute distress. Discharge instructions given. Patient verbalized understanding. No other issues noted at this time.   

## 2019-08-28 ENCOUNTER — Encounter: Payer: Self-pay | Admitting: General Surgery

## 2019-08-28 ENCOUNTER — Other Ambulatory Visit: Payer: Self-pay

## 2019-08-28 ENCOUNTER — Ambulatory Visit (INDEPENDENT_AMBULATORY_CARE_PROVIDER_SITE_OTHER): Payer: Self-pay | Admitting: General Surgery

## 2019-08-28 VITALS — BP 121/83 | HR 103 | Temp 98.4°F | Ht 64.0 in | Wt 227.6 lb

## 2019-08-28 DIAGNOSIS — R2231 Localized swelling, mass and lump, right upper limb: Secondary | ICD-10-CM

## 2019-08-28 MED ORDER — PREGABALIN 75 MG PO CAPS: 75.0000 mg | ORAL_CAPSULE | Freq: Two times a day (BID) | ORAL | 1 refills | Status: DC

## 2019-08-28 NOTE — Progress Notes (Signed)
Shelby Curtis is here today for a postoperative visit.  She underwent radical resection of an axillary mass on 20 August 2019.  A drain was placed and she stayed overnight for pain control.  She was discharged on oral narcotics as well as a muscle relaxer.  She has been emptying her drain, but did not record the outputs.  She says the most it has been has been 50 cc on a given day.  She is concerned that there is some leakage around the drain exit site.  She also has pain that she describes as prickling and pins-and-needles down her inner upper arm and chest wall.  Final pathology demonstrated:   Specimen Submitted:  A. Axillary mass, right   Clinical History: Right axillary mass     DIAGNOSIS:  A. SOFT TISSUE, RIGHT AXILLARY; EXCISION:  - BENIGN FIBROADIPOSE TISSUE, COMPATIBLE WITH LIPOMA, WITH FAT NECROSIS,  DYSTROPHIC CALCIFICATIONS, AND REACTIVE CHANGES.  - NEGATIVE FOR ATYPIA AND MALIGNANCY.   GROSS DESCRIPTION:  A. Labeled: Right axillary mass  Received: In formalin  Integrity: Intact  Orientation: Long stitch is lateral, short stitch superior lateral  stitch is deep  Ink: Entirely inked in black  Size: 22 x 15 x 6.0 cm  Weight: 732 g  Description: Multilobulated, entirely encapsulated by an intact, smooth,  shiny capsule measuring less than 0.1 cm in thickness. Serial sections  show completely fatty cut surface without solid lesions. There are two  centrally located fibrotic areas measuring 0.8 and 1.0 cm in greatest  dimension. No other abnormalities are grossly identified. No lymph  nodes or breast tissue are grossly identified.  Multiple representative sections are submitted in 13 cassettes   Today's Vitals   08/28/19 0953  BP: 121/83  Pulse: (!) 103  Temp: 98.4 F (36.9 C)  TempSrc: Temporal  SpO2: 98%  Weight: 227 lb 9.6 oz (103.2 kg)  Height: 5\' 4"  (1.626 m)  PainSc: 5   PainLoc: Arm   Body mass index is 39.07 kg/m.  Focused examination of the surgical  site: The operative dressing is still in place.  The drain sponge surrounding the exit site is saturated with serosanguineous fluid.  There is serosanguineous fluid in the JP bulb.  The surgical dressing was removed.  The large axillary incision is intact.  There is no erythema, induration, or purulent drainage present.  The drain exit site was inspected.  It appears to be snug within the skin.  The drain was stripped and appears to be free-flowing.  She is able to shrug her shoulders without difficulty and strength is equal bilaterally.  Impression and plan: This is a 33 year old woman who underwent radical resection of a 22 cm axillary mass.  It was benign on final pathology.  I discussed with her that the distribution of her pain likely reflects cutaneous nerve division secondary to the surgical incision.  She was instructed to keep a log of her drain outputs and was provided a sheet to facilitate this.  I demonstrated how to strip her drain.  I replaced the drain sponges and recommended that she change them as needed.  She may also use a thick heavy pad to help absorb any additional fluid in the area.  I will see her back next week, at which time hopefully we may remove her drain.  I prescribed Lyrica today to try and alleviate the nerve pain that she is experiencing.

## 2019-08-28 NOTE — Patient Instructions (Signed)
Patient will follow up with Dr.Cannon on 09/04/19 at 2:00pm.   Please contact our office if you have any questions or concerns.

## 2019-09-04 ENCOUNTER — Other Ambulatory Visit: Payer: Self-pay

## 2019-09-04 ENCOUNTER — Ambulatory Visit (INDEPENDENT_AMBULATORY_CARE_PROVIDER_SITE_OTHER): Payer: Medicaid Other | Admitting: General Surgery

## 2019-09-04 ENCOUNTER — Encounter: Payer: Self-pay | Admitting: General Surgery

## 2019-09-04 VITALS — BP 118/83 | HR 87 | Temp 97.7°F | Resp 14 | Ht 64.0 in | Wt 215.0 lb

## 2019-09-04 DIAGNOSIS — R2231 Localized swelling, mass and lump, right upper limb: Secondary | ICD-10-CM

## 2019-09-04 NOTE — Progress Notes (Signed)
Shelby Curtis is here today for a postoperative visit.  She is a 33 year old woman who underwent a radical resection of a right axillary mass.  Fortunately, the lesion was benign.  A drain was placed due to the large cavity left behind.  She has been emptying and recording the output since our last clinic visit.  On average, it appears to be putting out between 25 and 40 cc/day.  She says that the output is serosanguineous.  She denies any fevers or chills.  No nausea or vomiting.  No purulent drainage from her incision or drain site.  She does endorse weakness and numbness in her arm, though she says the pain is better.  Today's Vitals   09/04/19 1519  BP: 118/83  Pulse: 87  Resp: 14  Temp: 97.7 F (36.5 C)  TempSrc: Temporal  SpO2: 98%  Weight: 215 lb (97.5 kg)  Height: 5\' 4"  (1.626 m)  PainSc: 0-No pain   Body mass index is 36.9 kg/m.  Focused axillary exam: The incision is healing nicely.  There is no erythema, induration, or drainage present.  There is no fluctuance or palpable mass in the area.  The drain has a small amount of serosanguineous fluid present.  Impression and plan: This is a 33 year old woman who had a radical resection of a large axillary mass.  It was a benign lipoma on final pathology.  The output from her drain is still too high to remove it.  She will continue to record the output on a daily basis and contact us when it is been between 10-15 cc/day for several days in a row.  I offered her a referral to physical therapy to address the weakness and numbness that she has been experiencing in her arm after the operation.  She accepted this offer and we will make the appropriate referral.  I will see her back in clinic once the drain output is lower and remove the drain at that time.

## 2019-09-04 NOTE — Patient Instructions (Signed)
Referral sent to Physical Therapy. Someone will call you to schedule an appointment. Please call if you do not hear from someone within 7 days.   Please call our office when the drain output is 10-15 mls for two to three days to schedule an appointment.

## 2019-09-15 ENCOUNTER — Telehealth: Payer: Self-pay | Admitting: *Deleted

## 2019-09-15 NOTE — Telephone Encounter (Signed)
Patient called stating she was having less output in her drain and was told to give our office a call when she was. Patient is scheduled with Dr.Cannon for 09/16/19 at 4:00pm for possible removal of drain. Patient verbalizes understanding.

## 2019-09-15 NOTE — Telephone Encounter (Signed)
Patients drain has been 24ml or less for the last week now, she was told to call the office when the drainage is not as much.  She had surgery on 08/20/19 by Dr.Cannon right axillary mass

## 2019-09-16 ENCOUNTER — Other Ambulatory Visit: Payer: Self-pay

## 2019-09-16 ENCOUNTER — Ambulatory Visit (INDEPENDENT_AMBULATORY_CARE_PROVIDER_SITE_OTHER): Payer: Medicaid Other | Admitting: General Surgery

## 2019-09-16 ENCOUNTER — Encounter: Payer: Self-pay | Admitting: General Surgery

## 2019-09-16 VITALS — BP 123/83 | HR 94 | Temp 97.9°F | Resp 16 | Ht 64.0 in | Wt 229.2 lb

## 2019-09-16 DIAGNOSIS — R2231 Localized swelling, mass and lump, right upper limb: Secondary | ICD-10-CM

## 2019-09-16 NOTE — Patient Instructions (Signed)
Patient may take shower. Patient may work with physical therapy for exercises. Patient may shave.  Follow-up with our office as needed.  Please call and ask to speak with a nurse if you develop questions or concerns.

## 2019-09-16 NOTE — Progress Notes (Signed)
[name] is here today for a postoperative visit.  She is a 33 year old woman from whom I removed a very large axillary lipoma.  She has had a drain in place.  At her last visit, the drain output was too high to remove the drain.  She has been recording the output and has been less than 5 cc for the past 5 days.  She is here to have the drain removed.  She reports tenderness around the drain site, but denies any fevers or chills.  She still has numbness around the incision and down the upper inner part of her arm.  She has an appointment with physical therapy on December 2.  Today's Vitals   09/16/19 1612  BP: 123/83  Pulse: 94  Resp: 16  Temp: 97.9 F (36.6 C)  TempSrc: Temporal  SpO2: 98%  Weight: 229 lb 3.2 oz (104 kg)  Height: 5\' 4"  (1.626 m)   Body mass index is 39.34 kg/m. Focused examination: The axillary incision is healing nicely.  She does appear to have some scar hypertrophy beginning to form.  There is some local irritation at the drain site.  The drain was removed today without incident.  A dry dressing was placed.  The patient may change the dressing as needed to prevent soiling of her clothing while the site closes up.  I encouraged her to keep her physical therapy appointment.  For now, she has no ongoing surgical needs and we will see her on an as-needed basis.

## 2019-09-24 ENCOUNTER — Ambulatory Visit: Payer: Medicaid Other | Attending: General Surgery | Admitting: Physical Therapy

## 2019-09-29 ENCOUNTER — Encounter: Payer: Medicaid Other | Admitting: Physical Therapy

## 2019-10-01 ENCOUNTER — Encounter: Payer: Medicaid Other | Admitting: Physical Therapy

## 2019-10-07 ENCOUNTER — Encounter: Payer: Medicaid Other | Admitting: Physical Therapy

## 2019-10-09 ENCOUNTER — Encounter: Payer: Medicaid Other | Admitting: Physical Therapy

## 2019-10-13 ENCOUNTER — Encounter: Payer: Medicaid Other | Admitting: Physical Therapy

## 2019-10-15 ENCOUNTER — Encounter: Payer: Medicaid Other | Admitting: Physical Therapy

## 2019-10-20 ENCOUNTER — Ambulatory Visit: Payer: Medicaid Other | Admitting: Physical Therapy

## 2019-10-20 ENCOUNTER — Encounter: Payer: Medicaid Other | Admitting: Physical Therapy

## 2019-10-22 ENCOUNTER — Encounter: Payer: Medicaid Other | Admitting: Physical Therapy

## 2019-10-27 ENCOUNTER — Encounter: Payer: Medicaid Other | Admitting: Physical Therapy

## 2019-10-31 ENCOUNTER — Encounter: Payer: Medicaid Other | Admitting: Physical Therapy

## 2019-11-03 ENCOUNTER — Encounter: Payer: Medicaid Other | Admitting: Physical Therapy

## 2019-11-07 ENCOUNTER — Encounter: Payer: Medicaid Other | Admitting: Physical Therapy

## 2019-11-10 ENCOUNTER — Encounter: Payer: Medicaid Other | Admitting: Physical Therapy

## 2019-11-14 ENCOUNTER — Encounter: Payer: Medicaid Other | Admitting: Physical Therapy

## 2019-11-17 ENCOUNTER — Encounter: Payer: Medicaid Other | Admitting: Physical Therapy

## 2019-11-21 ENCOUNTER — Encounter: Payer: Medicaid Other | Admitting: Physical Therapy

## 2020-09-24 ENCOUNTER — Emergency Department
Admission: EM | Admit: 2020-09-24 | Discharge: 2020-09-24 | Disposition: A | Discharge status: Home / Self Care | Payer: Medicaid Other | Attending: Emergency Medicine | Admitting: Emergency Medicine

## 2020-09-24 ENCOUNTER — Emergency Department: Payer: Medicaid Other

## 2020-09-24 ENCOUNTER — Other Ambulatory Visit: Payer: Self-pay

## 2020-09-24 ENCOUNTER — Encounter: Payer: Self-pay | Admitting: Emergency Medicine

## 2020-09-24 DIAGNOSIS — R110 Nausea: Secondary | ICD-10-CM | POA: Insufficient documentation

## 2020-09-24 DIAGNOSIS — N3001 Acute cystitis with hematuria: Secondary | ICD-10-CM | POA: Diagnosis not present

## 2020-09-24 DIAGNOSIS — E860 Dehydration: Secondary | ICD-10-CM | POA: Insufficient documentation

## 2020-09-24 DIAGNOSIS — Z87891 Personal history of nicotine dependence: Secondary | ICD-10-CM | POA: Insufficient documentation

## 2020-09-24 DIAGNOSIS — R630 Anorexia: Secondary | ICD-10-CM | POA: Diagnosis not present

## 2020-09-24 DIAGNOSIS — R300 Dysuria: Secondary | ICD-10-CM | POA: Diagnosis present

## 2020-09-24 LAB — POC URINE PREG, ED: Preg Test, Ur: NEGATIVE

## 2020-09-24 LAB — URINALYSIS, COMPLETE (UACMP) WITH MICROSCOPIC
Bilirubin Urine: NEGATIVE
Glucose, UA: NEGATIVE mg/dL
Ketones, ur: NEGATIVE mg/dL
Nitrite: POSITIVE — AB
Protein, ur: 30 mg/dL — AB
Specific Gravity, Urine: 1.015 (ref 1.005–1.030)
WBC, UA: >50 WBC/hpf — ABNORMAL HIGH (ref 0–5)
pH: 7 (ref 5.0–8.0)

## 2020-09-24 LAB — CBC
HCT: 37.3 % (ref 36.0–46.0)
Hemoglobin: 12.1 g/dL (ref 12.0–15.0)
MCH: 26.8 pg (ref 26.0–34.0)
MCHC: 32.4 g/dL (ref 30.0–36.0)
MCV: 82.5 fL (ref 80.0–100.0)
Platelets: 287 10*3/uL (ref 150–400)
RBC: 4.52 MIL/uL (ref 3.87–5.11)
RDW: 13.3 % (ref 11.5–15.5)
WBC: 10.9 10*3/uL — ABNORMAL HIGH (ref 4.0–10.5)
nRBC: 0 % (ref 0.0–0.2)

## 2020-09-24 LAB — COMPREHENSIVE METABOLIC PANEL
ALT: 16 U/L (ref 0–44)
AST: 15 U/L (ref 15–41)
Albumin: 4 g/dL (ref 3.5–5.0)
Alkaline Phosphatase: 59 U/L (ref 38–126)
Anion gap: 10 (ref 5–15)
BUN: 15 mg/dL (ref 6–20)
CO2: 25 mmol/L (ref 22–32)
Calcium: 9 mg/dL (ref 8.9–10.3)
Chloride: 103 mmol/L (ref 98–111)
Creatinine, Ser: 0.73 mg/dL (ref 0.44–1.00)
GFR, Estimated: >60 mL/min (ref >60)
Glucose, Bld: 101 mg/dL — ABNORMAL HIGH (ref 70–99)
Potassium: 3.8 mmol/L (ref 3.5–5.1)
Sodium: 138 mmol/L (ref 135–145)
Total Bilirubin: 0.5 mg/dL (ref 0.3–1.2)
Total Protein: 7.6 g/dL (ref 6.5–8.1)

## 2020-09-24 LAB — LIPASE, BLOOD: Lipase: 20 U/L (ref 11–51)

## 2020-09-24 MED ORDER — KETOROLAC TROMETHAMINE 30 MG/ML IJ SOLN
15.0000 mg | Freq: Once | INTRAMUSCULAR | Status: AC
  Administered 2020-09-24: 15 mg via INTRAVENOUS
  Filled 2020-09-24: qty 1

## 2020-09-24 MED ORDER — ONDANSETRON HCL 4 MG/2ML IJ SOLN
4.0000 mg | Freq: Once | INTRAMUSCULAR | Status: AC
  Administered 2020-09-24: 4 mg via INTRAVENOUS
  Filled 2020-09-24: qty 2

## 2020-09-24 MED ORDER — CEPHALEXIN 500 MG PO CAPS: 500.0000 mg | ORAL_CAPSULE | Freq: Three times a day (TID) | ORAL | 0 refills | Status: AC

## 2020-09-24 MED ORDER — SODIUM CHLORIDE 0.9 % IV SOLN
2.0000 g | Freq: Once | INTRAVENOUS | Status: AC
  Administered 2020-09-24: 2 g via INTRAVENOUS
  Filled 2020-09-24: qty 20

## 2020-09-24 MED ORDER — FLUCONAZOLE 150 MG PO TABS: 150.0000 mg | ORAL_TABLET | Freq: Every day | ORAL | 0 refills | Status: AC

## 2020-09-24 MED ORDER — ONDANSETRON 4 MG PO TBDP: 4.0000 mg | ORAL_TABLET | Freq: Three times a day (TID) | ORAL | 0 refills | Status: DC | PRN

## 2020-09-24 MED ORDER — SODIUM CHLORIDE 0.9 % IV BOLUS
1000.0000 mL | Freq: Once | INTRAVENOUS | Status: AC
  Administered 2020-09-24: 1000 mL via INTRAVENOUS

## 2020-09-24 NOTE — ED Notes (Signed)
Pt denies nausea/vomiting currently. Sitting calmly in bed. Has been able to eat and drink.

## 2020-09-24 NOTE — ED Provider Notes (Signed)
[facility] Emergency Department Provider Note  ____________________________________________   First MD Initiated Contact with Patient 09/24/20 1324     (approximate)  I have reviewed the triage vital signs and the nursing notes.   HISTORY  Chief Complaint Abdominal Pain and Back Pain    HPI Shelby Curtis is a 34 y.o. female  With h/o depression here with dysuria, frequency, and abd pain. Pt reports that her sx started approx 1 week ago with mild urinary frequency and urgency, with a stinging sensation when she urinated. She started taking AZo otc with mild improvement in stinging, but has had ongoing frequency. She has now developed fever, chills, nausea, and loss of appetite. She has felt generally weak and tired. She states she has not had any vaginal bleeding or discharge. No h/o frequent UTIs. She does have a h/o kidney stones and notes some R flank pain, though this began after her urinary sx. Does not feel like her prior kidney stones. No cough. No diarrhea or constipation.        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 Authorizing Provider  cephALEXin (KEFLEX) 500 MG capsule Take 1 capsule (500 mg total) by mouth 3 (three) times daily for 10 days. 09/24/20 10/04/20  Duffy Bruce, MD  EPINEPHrine 0.3 mg/0.3 mL IJ SOAJ injection Inject into the muscle. 06/17/14    [provider]  fluconazole (DIFLUCAN) 150 MG tablet Take 1 tablet (150 mg total) by mouth daily for 1 dose. After antibiotics, for yeast infection 09/24/20 09/25/20  Duffy Bruce, MD  ibuprofen (ADVIL) 600 MG tablet Take 1 tablet (600 mg total) by mouth every 6 (six) hours as needed for mild pain. 08/21/19   Tylene Fantasia, PA-C  methocarbamol (ROBAXIN) 500 MG tablet Take 1 tablet (500 mg total) by mouth every 6 (six) hours as needed for muscle spasms. 08/21/19   Tylene Fantasia, PA-C  ondansetron (ZOFRAN ODT) 4 MG disintegrating tablet Take 1 tablet (4 mg total) by mouth every 8 (eight) hours as needed for nausea or vomiting. 09/24/20   Duffy Bruce, MD  oxyCODONE (OXY IR/ROXICODONE) 5 MG immediate release tablet Take 1 tablet (5 mg total) by mouth every 4 (four) hours as needed for severe pain or breakthrough pain. 08/21/19   Tylene Fantasia, PA-C    Allergies Patient has no known allergies.  Family History  Problem Relation Age of Onset   Cancer Maternal Grandmother 46       breast   Breast cancer Maternal Grandmother     Social History Social History   Tobacco Use   Smoking status: Former Smoker    Quit date: 08/13/2004    Years since quitting: 16.1   Smokeless tobacco: Never Used  Vaping Use   Vaping Use: Never used  Substance Use Topics  Alcohol use: No    Alcohol/week: 0.0 standard drinks   Drug use: No    Review of Systems  Review of Systems  Constitutional: Positive for fatigue. Negative for fever.  HENT: Negative for congestion and sore throat.   Eyes: Negative for visual disturbance.  Respiratory: Negative for cough and shortness of breath.   Cardiovascular: Negative for chest pain.  Gastrointestinal: Negative for abdominal pain, diarrhea, nausea and vomiting.  Genitourinary: Positive for dysuria, flank pain and frequency.  Musculoskeletal: Negative for back pain and neck pain.  Skin: Negative for rash and wound.  Neurological:  Negative for weakness.  All other systems reviewed and are negative.    ____________________________________________  PHYSICAL EXAM:      VITAL SIGNS: ED Triage Vitals  Enc Vitals Group     BP 09/24/20 1108 107/79     Pulse Rate 09/24/20 1108 (!) 103     Resp 09/24/20 1108 18     Temp 09/24/20 1108 98.1 F (36.7 C)     Temp Source 09/24/20 1108 Oral     SpO2 09/24/20 1108 98 %     Weight --      Height --      Head Circumference --      Peak Flow --      Pain Score 09/24/20 1117 6     Pain Loc --      Pain Edu? --      Excl. in [place]? --      Physical Exam Vitals and nursing note reviewed.  Constitutional:      General: She is not in acute distress.    Appearance: She is well-developed.  HENT:     Head: Normocephalic and atraumatic.  Eyes:     Conjunctiva/sclera: Conjunctivae normal.  Cardiovascular:     Rate and Rhythm: Normal rate and regular rhythm.     Heart sounds: Normal heart sounds. No murmur heard.  No friction rub.  Pulmonary:     Effort: Pulmonary effort is normal. No respiratory distress.     Breath sounds: Normal breath sounds. No wheezing or rales.  Abdominal:     General: There is no distension.     Palpations: Abdomen is soft.     Tenderness: There is abdominal tenderness in the suprapubic area. There is right CVA tenderness. There is no left CVA tenderness.  Musculoskeletal:     Cervical back: Neck supple.  Skin:    General: Skin is warm.     Capillary Refill: Capillary refill takes less than 2 seconds.  Neurological:     Mental Status: She is alert and oriented to person, place, and time.     Motor: No abnormal muscle tone.       ____________________________________________   LABS (all labs ordered are listed, but only abnormal results are displayed)  Labs Reviewed  COMPREHENSIVE METABOLIC PANEL - Abnormal; Notable for the following components:      Result Value   Glucose, Bld 101 (*)    All other components within normal limits    CBC - Abnormal; Notable for the following components:   WBC 10.9 (*)    All other components within normal limits  URINALYSIS, COMPLETE (UACMP) WITH MICROSCOPIC - Abnormal; Notable for the following components:   Color, Urine AMBER (*)    APPearance CLOUDY (*)    Hgb urine dipstick MODERATE (*)    Protein, ur 30 (*)    Nitrite POSITIVE (*)    Leukocytes,Ua LARGE (*)  WBC, UA >50 (*)    Bacteria, UA MANY (*)    All other components within normal limits  URINE CULTURE  LIPASE, BLOOD  POC URINE PREG, ED    ____________________________________________  EKG: None ________________________________________  RADIOLOGY All imaging, including plain films, CT scans, and ultrasounds, independently reviewed by me, and interpretations confirmed via formal radiology reads.  ED MD interpretation:   CT Stone: No stone, b/l stranding consistent with pyelo, no surgical abnormalities  Official radiology report(s): CT Renal Stone Study  Result Date: 09/24/2020 CLINICAL DATA:  Right lower abdominal and back pain. Urinary frequency. Burning with urination. EXAM: CT ABDOMEN AND PELVIS WITHOUT CONTRAST TECHNIQUE: Multidetector CT imaging of the abdomen and pelvis was performed following the standard protocol without IV contrast. COMPARISON:  07/05/2017 FINDINGS: Lower chest: Unremarkable. Hepatobiliary: No focal abnormality in the liver on this study without intravenous contrast. Gallbladder is nondistended with possible stone in the fundal region versus adenomyomatosis. No intrahepatic or extrahepatic biliary dilation. Pancreas: No focal mass lesion. No dilatation of the main duct. No intraparenchymal cyst. No peripancreatic edema. Spleen: No splenomegaly. No focal mass lesion. Adrenals/Urinary Tract: No adrenal nodule or mass. No stones are seen in either kidney or ureter, but there is mild parapelvic and periureteric edema bilaterally with mild bilateral ureteral fullness. No stones are seen in the  urinary bladder. Stomach/Bowel: Stomach is unremarkable. No gastric wall thickening. No evidence of outlet obstruction. Duodenum is normally positioned as is the ligament of Treitz. No small bowel wall thickening. No small bowel dilatation. The terminal ileum is normal. The appendix is normal. No gross colonic mass. No colonic wall thickening. Vascular/Lymphatic: No abdominal aortic aneurysm. No abdominal aortic atherosclerotic calcification. There is no gastrohepatic or hepatoduodenal ligament lymphadenopathy. No retroperitoneal or mesenteric lymphadenopathy. No pelvic sidewall lymphadenopathy. Reproductive: The uterus is unremarkable.  There is no adnexal mass. Other: No intraperitoneal free fluid. Musculoskeletal: No worrisome lytic or sclerotic osseous abnormality. IMPRESSION: 1. Mild bilateral parapelvic and periureteric edema with mild bilateral ureteral fullness. No urinary stone disease evident. Finding somewhat indeterminate, but given bilaterality, recent stone passage is considered unlikely. Infection/inflammation can produce this appearance. 2. Question noncalcified stone in the fundal region of the gallbladder versus adenomyomatosis, stable. Electronically Signed   By: Misty Stanley M.D.   On: 09/24/2020 14:58    ____________________________________________  PROCEDURES   Procedure(s) performed (including Critical Care):  Procedures  ____________________________________________  INITIAL IMPRESSION / MDM / [place] / ED COURSE  As part of my medical decision making, I reviewed the following data within the [place] notes reviewed and incorporated, Old chart reviewed, Notes from prior ED visits, and [place] Controlled Substance Database       *Shelby Curtis was evaluated in Emergency Department on 09/24/2020 for the symptoms described in the history of present illness. She was evaluated in the context of the global COVID-19 pandemic, which necessitated  consideration that the patient might be at risk for infection with the SARS-CoV-2 virus that causes COVID-19. Institutional protocols and algorithms that pertain to the evaluation of patients at risk for COVID-19 are in a state of rapid change based on information released by regulatory bodies including the CDC and federal and state organizations. These policies and algorithms were followed during the patient's care in the ED.  Some ED evaluations and interventions may be delayed as a result of limited staffing during the pandemic.*     Medical Decision Making:  34 yo F here with dysuria, frequency,  mild flank pain. Suspect UTI with possible early pyelo. CT scan reviewed, shows no evidence of stone or obstruction. Lab work shows mild leukocytosis, UA consistent with UTI but CMP without evidence of AKI or acidosis. Pt given fluids, IV Rocephin, and analgesia here w/ improvement. She is now tolerating PO without difficulty. Will treat with outpt abx, antiemetics, and follow-up. Return precautions given.  ____________________________________________  FINAL CLINICAL IMPRESSION(S) / ED DIAGNOSES  Final diagnoses:  Acute cystitis with hematuria  Dehydration     MEDICATIONS GIVEN DURING THIS VISIT:  Medications  sodium chloride 0.9 % bolus 1,000 mL (1,000 mLs Intravenous New Bag/Given 09/24/20 1414)  cefTRIAXone (ROCEPHIN) 2 g in sodium chloride 0.9 % 100 mL IVPB (0 g Intravenous Stopped 09/24/20 1500)  ondansetron (ZOFRAN) injection 4 mg (4 mg Intravenous Given 09/24/20 1415)  ketorolac (TORADOL) 30 MG/ML injection 15 mg (15 mg Intravenous Given 09/24/20 1415)     ED Discharge Orders         Ordered    cephALEXin (KEFLEX) 500 MG capsule  3 times daily        09/24/20 1552    ondansetron (ZOFRAN ODT) 4 MG disintegrating tablet  Every 8 hours PRN        09/24/20 1552    fluconazole (DIFLUCAN) 150 MG tablet  Daily        09/24/20 1552           Note:  This document was prepared using  Dragon voice recognition software and may include unintentional dictation errors.   Duffy Bruce, MD 09/24/20 [phone]

## 2020-09-24 NOTE — ED Notes (Signed)
Pt reports that urine sample sent earlier was not a clean catch with use of wipe so able to make culture an add-on.

## 2020-09-24 NOTE — ED Notes (Signed)
Pt transported to radiology.

## 2020-09-24 NOTE — ED Notes (Signed)
Pt back from imaging

## 2020-09-24 NOTE — ED Triage Notes (Signed)
Pt to ED via POV c/o right lower abdominal pain and back pain. Pt states that she has also had frequent urination. Pt reports that she had fever a few days ago and that she had burning when she urinated. Pt is currently afebrile and in NAD.

## 2020-09-24 NOTE — ED Notes (Signed)
Pt given snack and drink for PO challenge per EDP Isaacs verbal order. Pt sitting calmly in bed.

## 2020-09-27 LAB — URINE CULTURE: Culture: >=100000 — AB

## 2020-09-27 IMAGING — MR MR CHEST MEDIASTINUM WO/W CM
8 series · 16 of 16 positions shown · IV contrast (gadavist)
Comparison: Abdominopelvic CT 07/05/2017. Breast ultrasound
12/22/2016.

CLINICAL DATA: Enlarging tender right axillary mass over the last 2
years. Numbness in the right hand.

EXAM:
MRI CHEST WITHOUT AND WITH CONTRAST
TECHNIQUE: Multiplanar, multisequence MR imaging of the chest with attention to
the right axilla was performed. Study includes images following
intravenous contrast administration.
CONTRAST:  10mL GADAVIST GADOBUTROL 1 MMOL/ML IV SOLN

[Series 2: T1 · axial · 5.0mm · 0.75mm/px · z∈[-175,+59]mm · 2 of 40 slices shown (1 of 2)]
[im 1/40]
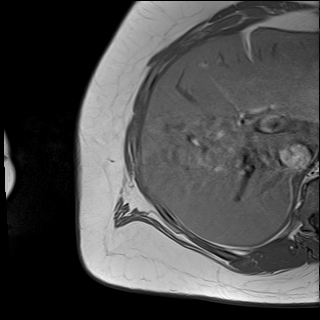
[im 40/40]
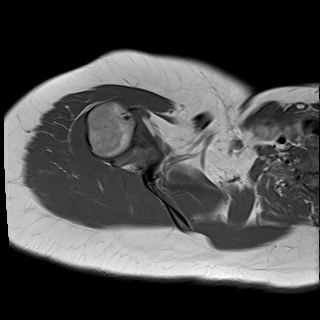

[Series 3: T2 fat-sat · axial · 5.0mm · 0.68mm/px · z∈[-175,+59]mm · 2 of 40 slices shown]
[im 1/40]
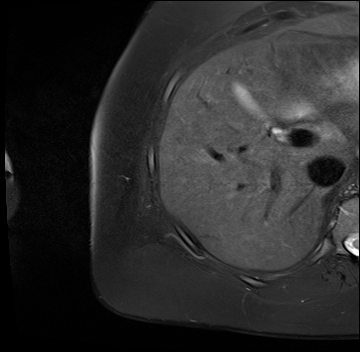
[im 40/40]
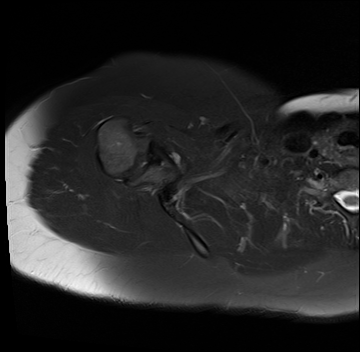

[Series 4: T1 fat-sat · axial · 5.0mm · 0.75mm/px · z∈[-175,+59]mm · 2 of 40 slices shown]
[im 1/40]
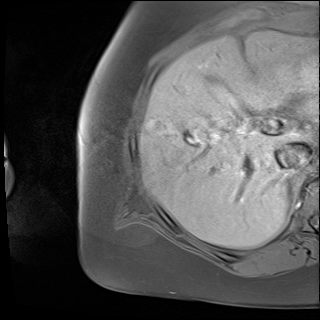
[im 40/40]
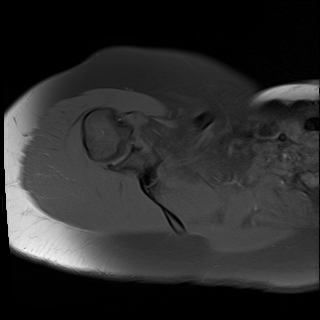

[Series 5: STIR · coronal · 4.0mm · 0.94mm/px · 2 of 36 slices shown]
[im 1/36]
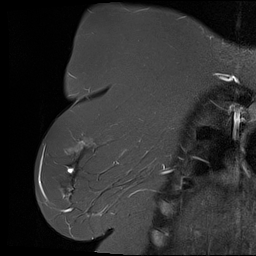
[im 36/36]
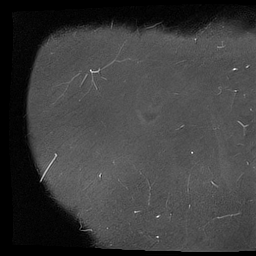

[Series 6: T1 · coronal · 4.0mm · 0.75mm/px · 2 of 36 slices shown (2 of 2)]
[im 1/36]
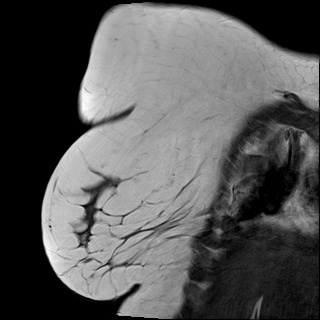
[im 36/36]
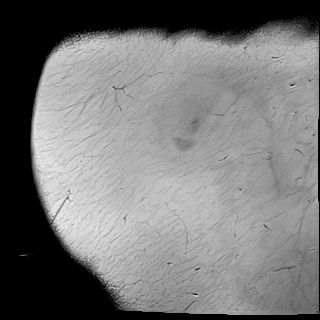

[Series 7: PD fat-sat · sagittal · 4.0mm · 0.75mm/px · 2 of 40 slices shown]
[im 1/40]
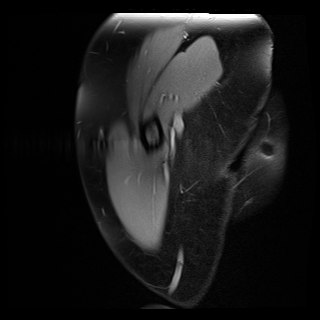
[im 40/40]
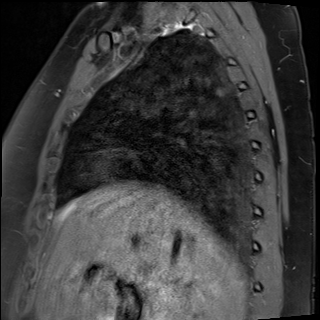

[Series 8: T1 fat-sat post-contrast · axial · 5.0mm · 0.75mm/px · z∈[-175,+59]mm · 2 of 40 slices shown (1 of 2)]
[im 1/40]
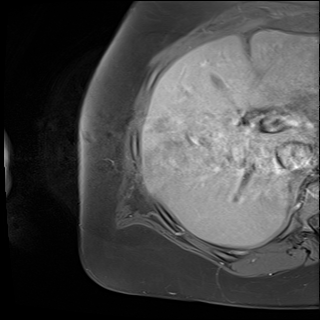
[im 40/40]
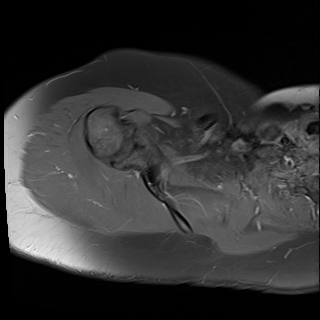

[Series 9: T1 fat-sat post-contrast · coronal · 4.0mm · 0.75mm/px · 2 of 36 slices shown (2 of 2)]
[im 1/36]
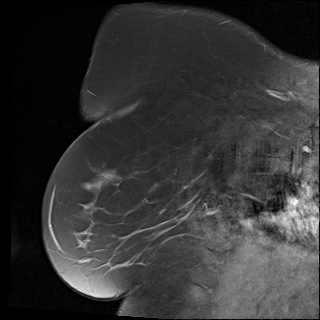
[im 36/36]
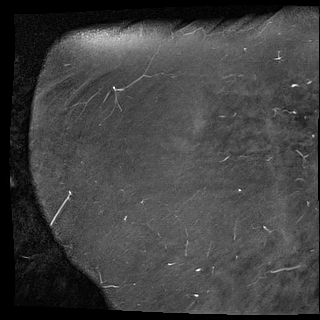

[16 of 16 positions shown; findings below may reference images not displayed]

FINDINGS: Bones/Joint/Cartilage

No rib lesions are identified. The visualized right humerus, scapula
and clavicle appear normal.

Ligaments

Not relevant for exam/indication.

Muscles and Tendons
The visualized right shoulder and chest wall muscles appear normal.

Soft tissues
There is a very large lobulated fatty mass within the right axilla.
This measures approximately 13.2 x 7.9 cm transverse (image [DATE]),
and extends approximately 18 cm in length on the coronal images.
This has enlarged from the previous ultrasound at which time it was
reported to measure 11.4 x 11.5 x 6.5 cm. Most of the fat within
this lesion is homogeneous, although there are scattered nodular
areas with mild associated peripheral linear T2 hyperintensity and
enhancement, likely reflecting fat necrosis. The largest focal
nodularity measures 2.8 cm on image [DATE]. There are no solid or
otherwise aggressive features. There is no involvement of the right
chest wall muscles or ribs.
IMPRESSION: 1. Large lobulated fatty mass within the right axilla has enlarged
from the previous ultrasound. The features of this lesion remain
most consistent with a benign lipoma, although low-grade liposarcoma
cannot be completely excluded, especially given the growth of this
lesion. Surgical excision should be considered.
2. No aggressive characteristics identified. Probable small areas of
associated fat necrosis.
3. No evidence of metastatic disease or involvement of the chest
wall muscles or ribs.

## 2021-07-15 ENCOUNTER — Encounter: Payer: Self-pay | Admitting: General Surgery

## 2021-11-09 ENCOUNTER — Other Ambulatory Visit: Payer: Self-pay

## 2021-11-09 ENCOUNTER — Ambulatory Visit
Admission: EM | Admit: 2021-11-09 | Discharge: 2021-11-09 | Disposition: A | Discharge status: Home / Self Care | Payer: Medicaid Other | Attending: Emergency Medicine | Admitting: Emergency Medicine

## 2021-11-09 DIAGNOSIS — H538 Other visual disturbances: Secondary | ICD-10-CM | POA: Diagnosis not present

## 2021-11-09 DIAGNOSIS — T7840XA Allergy, unspecified, initial encounter: Secondary | ICD-10-CM

## 2021-11-09 MED ORDER — PREDNISONE 50 MG PO TABS
60.0000 mg | ORAL_TABLET | Freq: Every day | ORAL | Status: DC
  Administered 2021-11-09: 60 mg via ORAL

## 2021-11-09 MED ORDER — FAMOTIDINE 20 MG PO TABS: 20.0000 mg | ORAL_TABLET | Freq: Two times a day (BID) | ORAL | 0 refills | Status: AC

## 2021-11-09 MED ORDER — CETIRIZINE HCL 10 MG PO TABS: 10.0000 mg | ORAL_TABLET | Freq: Every day | ORAL | 0 refills | Status: AC

## 2021-11-09 MED ORDER — PREDNISONE 20 MG PO TABS: 60.0000 mg | ORAL_TABLET | Freq: Every day | ORAL | 0 refills | Status: AC

## 2021-11-09 MED ORDER — FAMOTIDINE 20 MG PO TABS
20.0000 mg | ORAL_TABLET | Freq: Once | ORAL | Status: AC
  Administered 2021-11-09: 20 mg via ORAL

## 2021-11-09 NOTE — Discharge Instructions (Addendum)
Take over-the-counter  Zyrtec 10 mg daily to help with your itching.  You can take over-the-counter Benadryl, 50 mg at bedtime, as needed for itching and sleep.  Take the prednisone pack according to the package instructions.  You will taken on tapering dose over a period of 6 days.  Take it with food and always take it first in the morning with breakfast.  Take over-the-counter Pepcid 20 mg twice daily to help with itching as well.  If you develop any swelling of your lips or tongue, tightness in your throat, or difficulty breathing you need to go to the ER for evaluation.   Please go to [facility] in [place] at [place]. for evaluation of the blurriness to right eye.  They are able to see you this afternoon at 1:45 PM.

## 2021-11-09 NOTE — ED Provider Notes (Signed)
[facility]    CSN: [id number] Arrival date & time: 11/09/21  0912      History   Chief Complaint Chief Complaint  Patient presents with   Allergic Reaction    HPI Shelby Curtis is a 36 y.o. female.   HPI  36 year old female presenting for evaluation of possible allergic reaction.  Patient reports that she notices blurry vision in the upper half of her right eye yesterday and then she noticed swelling to her right upper eyelid this morning which was accompanied by a rash on the right cheek, tingling of her tongue, itching around her lips, and chest tightness.  She has had allergic reactions in the past and been treated with epinephrine.  She did not give any epi this time.  She has not taken any medications.  She also denies nausea or vomiting.  She denies any new cosmetics, personal hygiene products, laundry detergents, foods, meds, or supplements.  She states that her last allergic reaction they thought might of come from a bug bite but she has never seen an allergist for a definitive finding.  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    Past Surgical History:  Procedure Laterality Date   CESAREAN SECTION  2004, 2007   LIPOMA EXCISION Right 08/20/2019   Procedure: EXCISION LIPOMA-excision of right axillary mass;  Surgeon: Fredirick Maudlin, MD;  Location: [facility] ORS;  Service: General;  Laterality: Right;   TUBAL LIGATION      OB History     Gravida  3   Para  2   Term      Preterm      AB  1   Living  2      SAB  1   IAB      Ectopic      Multiple      Live Births           Obstetric Comments  1st Menstrual Cycle:  12 1st Pregnancy:  17          Home Medications    Prior to Admission medications   Medication Sig Start  Date End Date Taking? Authorizing Provider  cetirizine (ZYRTEC) 10 MG tablet Take 1 tablet (10 mg total) by mouth daily. 11/09/21  Yes Margarette Canada, NP  famotidine (PEPCID) 20 MG tablet Take 1 tablet (20 mg total) by mouth 2 (two) times daily. 11/09/21  Yes Margarette Canada, NP  predniSONE (DELTASONE) 20 MG tablet Take 3 tablets (60 mg total) by mouth daily with breakfast for 5 days. 3 tablets daily for 5 days. 11/09/21 11/14/21 Yes Margarette Canada, NP  EPINEPHrine 0.3 mg/0.3 mL IJ SOAJ injection Inject into the muscle. 06/17/14   [provider]  ibuprofen (ADVIL) 600 MG tablet Take 1 tablet (600 mg total) by mouth every 6 (six) hours as needed for mild pain. 08/21/19   Tylene Fantasia, PA-C  methocarbamol (ROBAXIN) 500 MG tablet Take 1 tablet (500 mg total) by mouth every 6 (six) hours as needed for muscle spasms. 08/21/19   Tylene Fantasia, PA-C  ondansetron (ZOFRAN ODT) 4 MG disintegrating tablet Take 1 tablet (4 mg total) by mouth every 8 (eight) hours as needed for nausea or vomiting.  09/24/20   Duffy Bruce, MD  oxyCODONE (OXY IR/ROXICODONE) 5 MG immediate release tablet Take 1 tablet (5 mg total) by mouth every 4 (four) hours as needed for severe pain or breakthrough pain. 08/21/19   Tylene Fantasia, PA-C    Family History Family History  Problem Relation Age of Onset   Cancer Maternal Grandmother 34       breast   Breast cancer Maternal Grandmother     Social History Social History   Tobacco Use   Smoking status: Former    Types: Cigarettes    Quit date: 08/13/2004    Years since quitting: 17.2   Smokeless tobacco: Never  Vaping Use   Vaping Use: Never used  Substance Use Topics   Alcohol use: No    Alcohol/week: 0.0 standard drinks   Drug use: No     Allergies   Patient has no known allergies.   Review of Systems Review of Systems  HENT:  Positive for facial swelling. Negative for trouble swallowing and voice change.   Eyes:  Positive for visual disturbance.  Negative for discharge.  Respiratory:  Positive for chest tightness. Negative for shortness of breath, wheezing and stridor.   Hematological: Negative.   Psychiatric/Behavioral: Negative.      Physical Exam Triage Vital Signs ED Triage Vitals [11/09/21 0926]  Enc Vitals Group     BP 125/86     Pulse Rate 84     Resp 18     Temp 98.3 F (36.8 C)     Temp Source Oral     SpO2 100 %     Weight 215 lb (97.5 kg)     Height 5\' 5"  (1.651 m)     Head Circumference      Peak Flow      Pain Score 0     Pain Loc      Pain Edu?      Excl. in [place]?    No data found.  Updated Vital Signs BP 125/86 (BP Location: Left Arm)    Pulse 84    Temp 98.3 F (36.8 C) (Oral)    Resp 18    Ht 5\' 5"  (1.651 m)    Wt 215 lb (97.5 kg)    LMP 10/26/2021 (Approximate)    SpO2 100%    BMI 35.78 kg/m   Visual Acuity Right Eye Distance: 20/25 (Without correction) Left Eye Distance: 20/20 (Without correction) Bilateral Distance: 20/20 (Without correction)  Right Eye Near:   Left Eye Near:    Bilateral Near:     Physical Exam Vitals and nursing note reviewed.  Constitutional:      Appearance: Normal appearance. She is not ill-appearing.  HENT:     Head: Normocephalic and atraumatic.     Right Ear: Tympanic membrane, ear canal and external ear normal. There is no impacted cerumen.     Left Ear: Tympanic membrane, ear canal and external ear normal. There is no impacted cerumen.     Mouth/Throat:     Mouth: Mucous membranes are moist.     Pharynx: Oropharynx is clear. No oropharyngeal exudate or posterior oropharyngeal erythema.  Eyes:     General: No scleral icterus.       Right eye: No discharge.        Left eye: No discharge.     Extraocular Movements: Extraocular movements intact.     Conjunctiva/sclera: Conjunctivae normal.     Pupils: Pupils are equal, round, and reactive to light.  Comments: Mild edema without erythema to the right upper eyelid.  Cardiovascular:     Rate and Rhythm:  Normal rate and regular rhythm.     Pulses: Normal pulses.     Heart sounds: Normal heart sounds. No murmur heard.   No friction rub. No gallop.  Pulmonary:     Effort: Pulmonary effort is normal.     Breath sounds: Normal breath sounds. No wheezing, rhonchi or rales.  Musculoskeletal:     Cervical back: Normal range of motion and neck supple.  Lymphadenopathy:     Cervical: No cervical adenopathy.  Skin:    General: Skin is warm and dry.     Capillary Refill: Capillary refill takes less than 2 seconds.     Findings: Erythema and rash present.  Neurological:     General: No focal deficit present.     Mental Status: She is alert and oriented to person, place, and time.  Psychiatric:        Mood and Affect: Mood normal.        Behavior: Behavior normal.        Thought Content: Thought content normal.        Judgment: Judgment normal.     UC Treatments / Results  Labs (all labs ordered are listed, but only abnormal results are displayed) Labs Reviewed - No data to display  EKG   Radiology No results found.  Procedures Procedures (including critical care time)  Medications Ordered in UC Medications  predniSONE (DELTASONE) tablet 60 mg (60 mg Oral Given 11/09/21 1001)  famotidine (PEPCID) tablet 20 mg (20 mg Oral Given 11/09/21 1007)    Initial Impression / Assessment and Plan / UC Course  I have reviewed the triage vital signs and the nursing notes.  Pertinent labs & imaging results that were available during my care of the patient were reviewed by me and considered in my medical decision making (see chart for details).  A very pleasant, nontoxic-appearing 36 year old female here for evaluation of possible allergic reaction.  She reports that her symptoms began yesterday with blurry vision in the upper hemisphere of her right eye that was followed by swelling of her right upper eyelid this morning.  No discharge or eye itching reported by the patient.  She states that she  did notice some redness and swelling to her right cheek, tingling of her tongue, itching around her lips, and chest tightness.  She is had no nausea or vomiting.  She has not taken any medication or give herself her EpiPen.  She has had allergic reactions several times in the past requiring epinephrine.  She denies any bites, change in personal hygiene products, change in laundry detergent, new foods, new meds, or new supplements.  She is unsure of what is causing her symptoms.  On exam patient does have mild edema but no erythema to the right upper eyelid.  The patient has a normal red light reflex and her pupils equal round and reactive.  No conjunctival injection or erythema noted.  No discharge.  Patient's pupils are equal round and reactive and her EOM is intact.  There is appear to be mild swelling to the right cheek with mild increase in erythema.  Both cheeks have an erythematous flush to them.  Oropharyngeal exam is benign.  No cervical lymphadenopathy appreciated exam.  No stridor auscultated over the trachea.  Cardiopulmonary exam brisk lung sounds in all fields.  Given the absence of erythema or edema of the tongue I  think it safe to treat this patient with oral medications.  Will medicate with 60 mg of prednisone in clinic and 20 mg of Pepcid.  I am hesitant to give Benadryl as she is driving herself.  The blurry vision in the upper hemisphere of her right eye does not have a clear etiology.  I have consulted Dr. Lazarus Salines with [facility] to see if he can evaluate her in clinic and determine the nature of the blurry vision.  I will discharge the patient on a prednisone burst dose of 60 mg daily for the next 5 days, 20 mg of Pepcid twice daily for the next 5 days, and Allegra 180 mg during the day and Benadryl 50 mg at night for allergy symptoms.  If she develops any tightness of her throat, wheezing, or difficulty breathing she is to give her self her epinephrine autoinjector and go to the ER for  evaluation.  Patient is a daily reveals 20/20 vision bilaterally with 20/25 in the right eye.  I spoke with Loree Fee, the nurse at [facility], and they will evaluate the patient in their clinic office in [place] at [address] this afternoon.   Final Clinical Impressions(s) / UC Diagnoses   Final diagnoses:  Allergic reaction, initial encounter  Blurry vision, right eye     Discharge Instructions      Take over-the-counter  Zyrtec 10 mg daily to help with your itching.  You can take over-the-counter Benadryl, 50 mg at bedtime, as needed for itching and sleep.  Take the prednisone pack according to the package instructions.  You will taken on tapering dose over a period of 6 days.  Take it with food and always take it first in the morning with breakfast.  Take over-the-counter Pepcid 20 mg twice daily to help with itching as well.  If you develop any swelling of your lips or tongue, tightness in your throat, or difficulty breathing you need to go to the ER for evaluation.   Please go to [facility] in [place] at [place]. for evaluation of the blurriness to right eye.  They are able to see you this afternoon at 1:45 PM.     ED Prescriptions     Medication Sig Dispense Auth. Provider   predniSONE (DELTASONE) 20 MG tablet Take 3 tablets (60 mg total) by mouth daily with breakfast for 5 days. 3 tablets daily for 5 days. 15 tablet Margarette Canada, NP   famotidine (PEPCID) 20 MG tablet Take 1 tablet (20 mg total) by mouth 2 (two) times daily. 30 tablet Margarette Canada, NP   cetirizine (ZYRTEC) 10 MG tablet Take 1 tablet (10 mg total) by mouth daily. 30 tablet Margarette Canada, NP      PDMP not reviewed this encounter.   Margarette Canada, NP 11/09/21 1013

## 2021-11-09 NOTE — ED Triage Notes (Signed)
Patient is here for "possible allergic reaction". Yesterday noticed "things were getting blurry out of right eye, this am upon waking up, right eye lid swollen, throat feels funny with tongue, tingling around lips, face". X2 allergic reactions in past. Has epi-pen "in past". Hasn't used this. No new exposure to allergen. No facial swelling. No obvious rash.

## 2022-08-29 ENCOUNTER — Encounter: Payer: Self-pay | Admitting: Dermatology

## 2022-08-29 ENCOUNTER — Ambulatory Visit (INDEPENDENT_AMBULATORY_CARE_PROVIDER_SITE_OTHER): Payer: Medicaid Other | Admitting: Dermatology

## 2022-08-29 DIAGNOSIS — L738 Other specified follicular disorders: Secondary | ICD-10-CM

## 2022-08-29 DIAGNOSIS — L918 Other hypertrophic disorders of the skin: Secondary | ICD-10-CM

## 2022-08-29 DIAGNOSIS — D485 Neoplasm of uncertain behavior of skin: Secondary | ICD-10-CM | POA: Diagnosis not present

## 2022-08-29 DIAGNOSIS — D2239 Melanocytic nevi of other parts of face: Secondary | ICD-10-CM

## 2022-08-29 DIAGNOSIS — D492 Neoplasm of unspecified behavior of bone, soft tissue, and skin: Secondary | ICD-10-CM

## 2022-08-29 DIAGNOSIS — D229 Melanocytic nevi, unspecified: Secondary | ICD-10-CM

## 2022-08-29 NOTE — Progress Notes (Signed)
   New Patient Visit  Subjective  Shelby SCHLEICHER is a 36 y.o. female who presents for the following: Nevus (Has moles on face she would like removed again. Had removed 2009-2010. Have grown back).  The patient has spots, moles and lesions to be evaluated, some may be new or changing and the patient has concerns that these could be cancer.  Review of Systems: No other skin or systemic complaints except as noted in HPI or Assessment and Plan.   Objective  Well appearing patient in no apparent distress; mood and affect are within normal limits.  A focused examination was performed including face. Relevant physical exam findings are noted in the Assessment and Plan.  Right medial Cheek 0.8cm tan papule     Right Lateral Cheek 0.5cm tan papule     left upper eyelid, right paranasal Erythematous tan-brown and/or pink-flesh-colored symmetric macules and papules.    Assessment & Plan   Sebaceous Hyperplasia - Small yellow papules with a central dell - Benign - Observe  Acrochordons (Skin Tags) - Fleshy, skin-colored pedunculated papules - Benign appearing.  - Observe. - If desired, they can be removed with an in office procedure that is not covered by insurance. - Please call the clinic if you notice any new or changing lesions.  Plan removal at next visit.   Neoplasm of skin (2) Right medial Cheek  Epidermal / dermal shaving  Lesion diameter (cm):  0.8 Informed consent: discussed and consent obtained   Patient was prepped and draped in usual sterile fashion: Area prepped with alcohol. Anesthesia: the lesion was anesthetized in a standard fashion   Anesthetic:  1% lidocaine w/ epinephrine 1-100,000 buffered w/ 8.4% NaHCO3 Instrument used: flexible razor blade   Hemostasis achieved with: pressure, aluminum chloride and electrodesiccation   Outcome: patient tolerated procedure well   Post-procedure details: wound care instructions given   Post-procedure details  comment:  Ointment and small bandage applied  Specimen 1 - Surgical pathology Differential Diagnosis: R/O irritated nevus  Check Margins: No  Right Lateral Cheek  Epidermal / dermal shaving  Lesion diameter (cm):  0.5 Informed consent: discussed and consent obtained   Patient was prepped and draped in usual sterile fashion: Area prepped with alcohol. Anesthesia: the lesion was anesthetized in a standard fashion   Anesthetic:  1% lidocaine w/ epinephrine 1-100,000 buffered w/ 8.4% NaHCO3 Instrument used: flexible razor blade   Hemostasis achieved with: pressure, aluminum chloride and electrodesiccation   Outcome: patient tolerated procedure well   Post-procedure details: wound care instructions given   Post-procedure details comment:  Ointment and small bandage applied  Specimen 2 - Surgical pathology Differential Diagnosis: R/O irritated nevus  Check Margins: No  Neoplasm of uncertain behavior of skin left upper eyelid, right paranasal  Symptomatic, irritated  Plan shave removal and biopsy r/o irritated nevi vs other at next visit.    Return for Mole removal 2-3 months.  I, Emelia Salisbury, CMA, am acting as scribe for Forest Gleason, MD.  Documentation: I have reviewed the above documentation for accuracy and completeness, and I agree with the above.  Forest Gleason, MD

## 2022-08-29 NOTE — Patient Instructions (Signed)
Recommend Serica moisturizing scar formula cream every night or [facility] brand or Mederma silicone scar sheet every night for the first year after a scar appears to help with scar remodeling if desired. Scars remodel on their own for a full year and will gradually improve in appearance over time.   Wound Care Instructions  Cleanse wound gently with soap and water once a day then pat dry with clean gauze. Apply a thin coat of Petrolatum (petroleum jelly, "Vaseline") over the wound (unless you have an allergy to this). We recommend that you use a new, sterile tube of Vaseline. Do not pick or remove scabs. Do not remove the yellow or white "healing tissue" from the base of the wound.  Cover the wound with fresh, clean, nonstick gauze and secure with paper tape. You may use Band-Aids in place of gauze and tape if the wound is small enough, but would recommend trimming much of the tape off as there is often too much. Sometimes Band-Aids can irritate the skin.  You should call the office for your biopsy report after 1 week if you have not already been contacted.  If you experience any problems, such as abnormal amounts of bleeding, swelling, significant bruising, significant pain, or evidence of infection, please call the office immediately.  FOR ADULT SURGERY PATIENTS: If you need something for pain relief you may take 1 extra strength Tylenol (acetaminophen) AND 2 Ibuprofen ('200mg'$  each) together every 4 hours as needed for pain. (do not take these if you are allergic to them or if you have a reason you should not take them.) Typically, you may only need pain medication for 1 to 3 days.     Due to recent changes in healthcare laws, you may see results of your pathology and/or laboratory studies on MyChart before the doctors have had a chance to review them. We understand that in some cases there may be results that are confusing or concerning to you. Please understand that not all results are received  at the same time and often the doctors may need to interpret multiple results in order to provide you with the best plan of care or course of treatment. Therefore, we ask that you please give [place] 2 business days to thoroughly review all your results before contacting the office for clarification. Should we see a critical lab result, you will be contacted sooner.   If You Need Anything After Your Visit  If you have any questions or concerns for your doctor, please call our main line at [phone] and press option 4 to reach your doctor's medical assistant. If no one answers, please leave a voicemail as directed and we will return your call as soon as possible. Messages left after 4 pm will be answered the following business day.   You may also send [place] a message via [place]. We typically respond to MyChart messages within 1-2 business days.  For prescription refills, please ask your pharmacy to contact our office. Our fax number is [phone].  If you have an urgent issue when the clinic is closed that cannot wait until the next business day, you can page your doctor at the number below.    Please note that while we do our best to be available for urgent issues outside of office hours, we are not available 24/7.   If you have an urgent issue and are unable to reach [place], you may choose to seek medical care at your doctor's office, retail clinic, urgent care  center, or emergency room.  If you have a medical emergency, please immediately call 911 or go to the emergency department.  Pager Numbers  - Dr. Nehemiah Massed: [phone]  - Dr. Laurence Ferrari: [phone]  - Dr. Nicole Kindred: [phone]  In the event of inclement weather, please call our main line at [phone] for an update on the status of any delays or closures.  Dermatology Medication Tips: Please keep the boxes that topical medications come in in order to help keep track of the instructions about where and how to use these. Pharmacies  typically print the medication instructions only on the boxes and not directly on the medication tubes.   If your medication is too expensive, please contact our office at [phone] option 4 or send [place] a message through [place].   We are unable to tell what your co-pay for medications will be in advance as this is different depending on your insurance coverage. However, we may be able to find a substitute medication at lower cost or fill out paperwork to get insurance to cover a needed medication.   If a prior authorization is required to get your medication covered by your insurance company, please allow [place] 1-2 business days to complete this process.  Drug prices often vary depending on where the prescription is filled and some pharmacies may offer cheaper prices.  The website www.goodrx.com contains coupons for medications through different pharmacies. The prices here do not account for what the cost may be with help from insurance (it may be cheaper with your insurance), but the website can give you the price if you did not use any insurance.  - You can print the associated coupon and take it with your prescription to the pharmacy.  - You may also stop by our office during regular business hours and pick up a GoodRx coupon card.  - If you need your prescription sent electronically to a different pharmacy, notify our office through [facility] or by phone at [phone] option 4.     Si Usted Necesita Algo Despus de Su Visita  Tambin puede enviarnos un mensaje a travs de Pharmacist, community. Por lo general respondemos a los mensajes de MyChart en el transcurso de 1 a 2 das hbiles.  Para renovar recetas, por favor pida a su farmacia que se ponga en contacto con nuestra oficina. Harland Dingwall de fax es [place] [phone].  Si tiene un asunto urgente cuando la clnica est cerrada y que no puede esperar hasta el siguiente da hbil, puede llamar/localizar a su doctor(a) al nmero que  aparece a continuacin.   Por favor, tenga en cuenta que aunque hacemos todo lo posible para estar disponibles para asuntos urgentes fuera del horario de [place], no estamos disponibles las 24 horas del da, los 7 das de la [place].   Si tiene un problema urgente y no puede comunicarse con nosotros, puede optar por buscar atencin mdica  en el consultorio de su doctor(a), en una clnica privada, en un centro de atencin urgente o en una sala de emergencias.  Si tiene Engineering geologist, por favor llame inmediatamente al 911 o vaya a la sala de emergencias.  Nmeros de bper  - Dr. Nehemiah Massed: [phone]  - Dra. Moye: [phone]  - Dra. Nicole Kindred: [phone]  En caso de inclemencias del [place], por favor llame a Johnsie Kindred principal al [phone] para una actualizacin sobre el [place] de cualquier retraso o cierre.  Consejos para la medicacin en dermatologa: Por favor, guarde las [employer]  que vienen los medicamentos de uso tpico para ayudarle a seguir las instrucciones sobre dnde y cmo usarlos. Las farmacias generalmente imprimen las instrucciones del medicamento slo en las cajas y no directamente en los tubos del [place].   Si su medicamento es muy caro, por favor, pngase en contacto con Zigmund Daniel llamando al [phone] y presione la opcin 4 o envenos un mensaje a travs de Pharmacist, community.   No podemos decirle cul ser su copago por los medicamentos por adelantado ya que esto es diferente dependiendo de la cobertura de su seguro. Sin embargo, es posible que podamos encontrar un medicamento sustituto a Electrical engineer un formulario para que el seguro cubra el medicamento que se considera necesario.   Si se requiere una autorizacin previa para que su compaa de seguros [place] su medicamento, por favor permtanos de 1 a 2 das hbiles para completar este proceso.  Los precios de los medicamentos varan con frecuencia dependiendo del Environmental consultant de dnde se surte  la receta y alguna farmacias pueden ofrecer precios ms baratos.  El sitio web www.goodrx.com tiene cupones para medicamentos de Airline pilot. Los precios aqu no tienen en cuenta lo que podra costar con la ayuda del seguro (puede ser ms barato con su seguro), pero el sitio web puede darle el precio si no utiliz Research scientist (physical sciences).  - Puede imprimir el cupn correspondiente y llevarlo con su receta a la farmacia.  - Tambin puede pasar por nuestra oficina durante el horario de atencin regular y Charity fundraiser una tarjeta de cupones de GoodRx.  - Si necesita que su receta se enve electrnicamente a una farmacia diferente, informe a nuestra oficina a travs de MyChart de [place] o por telfono llamando al [phone] y presione la opcin 4.

## 2022-09-02 ENCOUNTER — Encounter: Payer: Self-pay | Admitting: Dermatology

## 2022-09-04 ENCOUNTER — Telehealth: Payer: Self-pay

## 2022-09-04 NOTE — Telephone Encounter (Signed)
Discussed pathology results. Patient voiced understanding.  

## 2022-09-04 NOTE — Telephone Encounter (Signed)
-----   Message from Alfonso Patten, MD sent at 09/02/2022  3:36 PM EST ----- 1. Skin , right medial cheek MELANOCYTIC NEVUS, INTRADERMAL TYPE, IRRITATED  This is a NORMAL MOLE. No additional treatment is needed. If you notice any new or changing spots or have other skin concerns in future, please call our office at [phone].    2. Skin , right lateral cheek MELANOCYTIC NEVUS, COMPOUND TYPE, IRRITATED  This is a NORMAL MOLE. No additional treatment is needed. If you notice any new or changing spots or have other skin concerns in future, please call our office at [phone].    MAs please call. Thank you!

## 2022-11-02 ENCOUNTER — Ambulatory Visit (INDEPENDENT_AMBULATORY_CARE_PROVIDER_SITE_OTHER): Payer: Medicaid Other | Admitting: Dermatology

## 2022-11-02 ENCOUNTER — Encounter: Payer: Self-pay | Admitting: Dermatology

## 2022-11-02 VITALS — BP 130/77 | HR 91

## 2022-11-02 DIAGNOSIS — D220 Melanocytic nevi of lip: Secondary | ICD-10-CM

## 2022-11-02 DIAGNOSIS — D22121 Melanocytic nevi of left upper eyelid, including canthus: Secondary | ICD-10-CM

## 2022-11-02 DIAGNOSIS — D492 Neoplasm of unspecified behavior of bone, soft tissue, and skin: Secondary | ICD-10-CM

## 2022-11-02 DIAGNOSIS — L905 Scar conditions and fibrosis of skin: Secondary | ICD-10-CM

## 2022-11-02 DIAGNOSIS — D2239 Melanocytic nevi of other parts of face: Secondary | ICD-10-CM | POA: Diagnosis not present

## 2022-11-02 NOTE — Progress Notes (Signed)
Follow-Up Visit   Subjective  Shelby Curtis is a 37 y.o. female who presents for the following: Nevus (Here for removal of irritated moles on face).  The patient has spots, moles and lesions to be evaluated, some may be new or changing and the patient has concerns that these could be cancer.  The following portions of the chart were reviewed this encounter and updated as appropriate:  Tobacco  Allergies  Meds  Problems  Med Hx  Surg Hx  Fam Hx      Review of Systems: No other skin or systemic complaints except as noted in HPI or Assessment and Plan.   Objective  Well appearing patient in no apparent distress; mood and affect are within normal limits.  A focused examination was performed including face. Relevant physical exam findings are noted in the Assessment and Plan.  Left Upper Eyelid 0.4 cm erythematous papule     Right Nasofacial angle 0.4 cm erythematous papule     Right upper lip 0.6 cm erythematous papule     right lower cheek Dyspigmented smooth macule or patch.    Assessment & Plan  Neoplasm of skin (3) Left Upper Eyelid  Epidermal / dermal shaving  Lesion diameter (cm):  0.4 Informed consent: discussed and consent obtained   Patient was prepped and draped in usual sterile fashion: Area prepped with alcohol. Anesthesia: the lesion was anesthetized in a standard fashion   Anesthetic:  1% lidocaine w/ epinephrine 1-100,000 buffered w/ 8.4% NaHCO3 Instrument used: flexible razor blade   Hemostasis achieved with: pressure, aluminum chloride and electrodesiccation   Outcome: patient tolerated procedure well   Post-procedure details: wound care instructions given   Post-procedure details comment:  Ointment and small bandage applied  Specimen 1 - Surgical pathology Differential Diagnosis: R/O irritated nevus vs other   Check Margins: No  Right Nasofacial angle  Epidermal / dermal shaving  Lesion diameter (cm):  0.4 Informed consent:  discussed and consent obtained   Patient was prepped and draped in usual sterile fashion: Area prepped with alcohol. Anesthesia: the lesion was anesthetized in a standard fashion   Anesthetic:  1% lidocaine w/ epinephrine 1-100,000 buffered w/ 8.4% NaHCO3 Instrument used: flexible razor blade   Hemostasis achieved with: pressure, aluminum chloride and electrodesiccation   Outcome: patient tolerated procedure well   Post-procedure details: wound care instructions given   Post-procedure details comment:  Ointment and small bandage applied  Specimen 2 - Surgical pathology Differential Diagnosis: R/O irritated nevus vs other    Check Margins: No  Right upper lip  Epidermal / dermal shaving  Lesion diameter (cm):  0.6 Informed consent: discussed and consent obtained   Patient was prepped and draped in usual sterile fashion: Area prepped with alcohol. Anesthesia: the lesion was anesthetized in a standard fashion   Anesthetic:  1% lidocaine w/ epinephrine 1-100,000 buffered w/ 8.4% NaHCO3 Instrument used: flexible razor blade   Hemostasis achieved with: pressure, aluminum chloride and electrodesiccation   Outcome: patient tolerated procedure well   Post-procedure details: wound care instructions given   Post-procedure details comment:  Ointment and small bandage applied  Specimen 3 - Surgical pathology Differential Diagnosis: R/O irritated nevus vs other    Check Margins: No  Scar right lower cheek  Normal saline injected into lesion today.  Recommend Serica moisturizing scar formula cream every night or [facility] brand or Mederma silicone scar sheet every night for the first year after a scar appears to help with scar remodeling if desired.  Scars remodel on their own for a full year and will gradually improve in appearance over time.    Return for Mole removal 2-3 months.  ,I, Emelia Salisbury, CMA, am acting as scribe for Forest Gleason, MD.  Documentation: I have reviewed the  above documentation for accuracy and completeness, and I agree with the above.  Forest Gleason, MD

## 2022-11-02 NOTE — Patient Instructions (Addendum)
Wound Care Instructions  Cleanse wound gently with soap and water once a day then pat dry with clean gauze. Apply a thin coat of Petrolatum (petroleum jelly, "Vaseline") over the wound (unless you have an allergy to this). We recommend that you use a new, sterile tube of Vaseline. Do not pick or remove scabs. Do not remove the yellow or white "healing tissue" from the base of the wound.  Cover the wound with fresh, clean, nonstick gauze and secure with paper tape. You may use Band-Aids in place of gauze and tape if the wound is small enough, but would recommend trimming much of the tape off as there is often too much. Sometimes Band-Aids can irritate the skin.  You should call the office for your biopsy report after 1 week if you have not already been contacted.  If you experience any problems, such as abnormal amounts of bleeding, swelling, significant bruising, significant pain, or evidence of infection, please call the office immediately.  FOR ADULT SURGERY PATIENTS: If you need something for pain relief you may take 1 extra strength Tylenol (acetaminophen) AND 2 Ibuprofen ('200mg'$  each) together every 4 hours as needed for pain. (do not take these if you are allergic to them or if you have a reason you should not take them.) Typically, you may only need pain medication for 1 to 3 days.     Scar: Recommend Serica moisturizing scar formula cream every night or [facility] brand or Mederma silicone scar sheet every night for the first year after a scar appears to help with scar remodeling if desired. Scars remodel on their own for a full year and will gradually improve in appearance over time.     Due to recent changes in healthcare laws, you may see results of your pathology and/or laboratory studies on MyChart before the doctors have had a chance to review them. We understand that in some cases there may be results that are confusing or concerning to you. Please understand that not all results are  received at the same time and often the doctors may need to interpret multiple results in order to provide you with the best plan of care or course of treatment. Therefore, we ask that you please give [place] 2 business days to thoroughly review all your results before contacting the office for clarification. Should we see a critical lab result, you will be contacted sooner.   If You Need Anything After Your Visit  If you have any questions or concerns for your doctor, please call our main line at [phone] and press option 4 to reach your doctor's medical assistant. If no one answers, please leave a voicemail as directed and we will return your call as soon as possible. Messages left after 4 pm will be answered the following business day.   You may also send [place] a message via [place]. We typically respond to MyChart messages within 1-2 business days.  For prescription refills, please ask your pharmacy to contact our office. Our fax number is [phone].  If you have an urgent issue when the clinic is closed that cannot wait until the next business day, you can page your doctor at the number below.    Please note that while we do our best to be available for urgent issues outside of office hours, we are not available 24/7.   If you have an urgent issue and are unable to reach [place], you may choose to seek medical care at your doctor's office, retail  clinic, urgent care center, or emergency room.  If you have a medical emergency, please immediately call 911 or go to the emergency department.  Pager Numbers  - Dr. Nehemiah Massed: [phone]  - Dr. Laurence Ferrari: [phone]  - Dr. Nicole Kindred: [phone]  In the event of inclement weather, please call our main line at [phone] for an update on the status of any delays or closures.  Dermatology Medication Tips: Please keep the boxes that topical medications come in in order to help keep track of the instructions about where and how to use these.  Pharmacies typically print the medication instructions only on the boxes and not directly on the medication tubes.   If your medication is too expensive, please contact our office at [phone] option 4 or send [place] a message through [place].   We are unable to tell what your co-pay for medications will be in advance as this is different depending on your insurance coverage. However, we may be able to find a substitute medication at lower cost or fill out paperwork to get insurance to cover a needed medication.   If a prior authorization is required to get your medication covered by your insurance company, please allow [place] 1-2 business days to complete this process.  Drug prices often vary depending on where the prescription is filled and some pharmacies may offer cheaper prices.  The website www.goodrx.com contains coupons for medications through different pharmacies. The prices here do not account for what the cost may be with help from insurance (it may be cheaper with your insurance), but the website can give you the price if you did not use any insurance.  - You can print the associated coupon and take it with your prescription to the pharmacy.  - You may also stop by our office during regular business hours and pick up a GoodRx coupon card.  - If you need your prescription sent electronically to a different pharmacy, notify our office through [facility] or by phone at [phone] option 4.     Si Usted Necesita Algo Despus de Su Visita  Tambin puede enviarnos un mensaje a travs de Pharmacist, community. Por lo general respondemos a los mensajes de MyChart en el transcurso de 1 a 2 das hbiles.  Para renovar recetas, por favor pida a su farmacia que se ponga en contacto con nuestra oficina. Harland Dingwall de fax es [place] [phone].  Si tiene un asunto urgente cuando la clnica est cerrada y que no puede esperar hasta el siguiente da hbil, puede llamar/localizar a su doctor(a) al nmero  que aparece a continuacin.   Por favor, tenga en cuenta que aunque hacemos todo lo posible para estar disponibles para asuntos urgentes fuera del horario de [place], no estamos disponibles las 24 horas del da, los 7 das de la [place].   Si tiene un problema urgente y no puede comunicarse con nosotros, puede optar por buscar atencin mdica  en el consultorio de su doctor(a), en una clnica privada, en un centro de atencin urgente o en una sala de emergencias.  Si tiene Engineering geologist, por favor llame inmediatamente al 911 o vaya a la sala de emergencias.  Nmeros de bper  - Dr. Nehemiah Massed: [phone]  - Dra. Moye: [phone]  - Dra. Nicole Kindred: [phone]  En caso de inclemencias del [place], por favor llame a Johnsie Kindred principal al [phone] para una actualizacin sobre el [place] de cualquier retraso o cierre.  Consejos para la medicacin en dermatologa: Por favor, guarde las  cajas en las que vienen los medicamentos de uso tpico para ayudarle a seguir las instrucciones sobre dnde y cmo usarlos. Las farmacias generalmente imprimen las instrucciones del medicamento slo en las cajas y no directamente en los tubos del [place].   Si su medicamento es muy caro, por favor, pngase en contacto con Zigmund Daniel llamando al [phone] y presione la opcin 4 o envenos un mensaje a travs de Pharmacist, community.   No podemos decirle cul ser su copago por los medicamentos por adelantado ya que esto es diferente dependiendo de la cobertura de su seguro. Sin embargo, es posible que podamos encontrar un medicamento sustituto a Electrical engineer un formulario para que el seguro cubra el medicamento que se considera necesario.   Si se requiere una autorizacin previa para que su compaa de seguros [place] su medicamento, por favor permtanos de 1 a 2 das hbiles para completar este proceso.  Los precios de los medicamentos varan con frecuencia dependiendo del Environmental consultant de dnde se  surte la receta y alguna farmacias pueden ofrecer precios ms baratos.  El sitio web www.goodrx.com tiene cupones para medicamentos de Airline pilot. Los precios aqu no tienen en cuenta lo que podra costar con la ayuda del seguro (puede ser ms barato con su seguro), pero el sitio web puede darle el precio si no utiliz Research scientist (physical sciences).  - Puede imprimir el cupn correspondiente y llevarlo con su receta a la farmacia.  - Tambin puede pasar por nuestra oficina durante el horario de atencin regular y Charity fundraiser una tarjeta de cupones de GoodRx.  - Si necesita que su receta se enve electrnicamente a una farmacia diferente, informe a nuestra oficina a travs de MyChart de [place] o por telfono llamando al [phone] y presione la opcin 4.

## 2022-11-07 ENCOUNTER — Telehealth: Payer: Self-pay

## 2022-11-07 NOTE — Telephone Encounter (Addendum)
Called and discussed results with patient. She verbalized understanding and denied further questions at this time.   ----- Message from Alfonso Patten, MD sent at 11/07/2022  4:28 PM EST ----- 1. Skin , left upper eyelid MELANOCYTIC NEVUS, INTRADERMAL TYPE 2. Skin , right nasofacial angle MELANOCYTIC NEVUS, INTRADERMAL TYPE 3. Skin , right upper lip MELANOCYTIC NEVUS, INTRADERMAL TYPE  These are NORMAL MOLES. No additional treatment is needed. If you notice any new or changing spots or have other skin concerns in future, please call our office at [phone].     MAs please call. Thank you!

## 2022-11-13 ENCOUNTER — Encounter: Payer: Self-pay | Admitting: Dermatology

## 2023-01-03 ENCOUNTER — Ambulatory Visit (INDEPENDENT_AMBULATORY_CARE_PROVIDER_SITE_OTHER): Payer: Medicaid Other | Admitting: Dermatology

## 2023-01-03 VITALS — BP 152/97 | HR 103

## 2023-01-03 DIAGNOSIS — D2262 Melanocytic nevi of left upper limb, including shoulder: Secondary | ICD-10-CM | POA: Diagnosis not present

## 2023-01-03 DIAGNOSIS — D485 Neoplasm of uncertain behavior of skin: Secondary | ICD-10-CM | POA: Diagnosis not present

## 2023-01-03 DIAGNOSIS — D229 Melanocytic nevi, unspecified: Secondary | ICD-10-CM

## 2023-01-03 NOTE — Patient Instructions (Addendum)
Melanoma ABCDEs  Melanoma is the most dangerous type of skin cancer, and is the leading cause of death from skin disease.  You are more likely to develop melanoma if you: Have light-colored skin, light-colored eyes, or red or blond hair Spend a lot of time in the sun Tan regularly, either outdoors or in a tanning bed Have had blistering sunburns, especially during childhood Have a close family member who has had a melanoma Have atypical moles or large birthmarks  Early detection of melanoma is key since treatment is typically straightforward and cure rates are extremely high if we catch it early.   The first sign of melanoma is often a change in a mole or a new dark spot.  The ABCDE system is a way of remembering the signs of melanoma.  A for asymmetry:  The two halves do not match. B for border:  The edges of the growth are irregular. C for color:  A mixture of colors are present instead of an even brown color. D for diameter:  Melanomas are usually (but not always) greater than 6mm - the size of a pencil eraser. E for evolution:  The spot keeps changing in size, shape, and color.  Please check your skin once per month between visits. You can use a small mirror in front and a large mirror behind you to keep an eye on the back side or your body.   If you see any new or changing lesions before your next follow-up, please call to schedule a visit.  Please continue daily skin protection including broad spectrum sunscreen SPF 30+ to sun-exposed areas, reapplying every 2 hours as needed when you're outdoors.        Wound Care Instructions  Cleanse wound gently with soap and water once a day then pat dry with clean gauze. Apply a thin coat of Petrolatum (petroleum jelly, "Vaseline") over the wound (unless you have an allergy to this). We recommend that you use a new, sterile tube of Vaseline. Do not pick or remove scabs. Do not remove the yellow or white "healing tissue" from the base of  the wound.  Cover the wound with fresh, clean, nonstick gauze and secure with paper tape. You may use Band-Aids in place of gauze and tape if the wound is small enough, but would recommend trimming much of the tape off as there is often too much. Sometimes Band-Aids can irritate the skin.  You should call the office for your biopsy report after 1 week if you have not already been contacted.  If you experience any problems, such as abnormal amounts of bleeding, swelling, significant bruising, significant pain, or evidence of infection, please call the office immediately.  FOR ADULT SURGERY PATIENTS: If you need something for pain relief you may take 1 extra strength Tylenol (acetaminophen) AND 2 Ibuprofen (200mg each) together every 4 hours as needed for pain. (do not take these if you are allergic to them or if you have a reason you should not take them.) Typically, you may only need pain medication for 1 to 3 days.       Due to recent changes in healthcare laws, you may see results of your pathology and/or laboratory studies on MyChart before the doctors have had a chance to review them. We understand that in some cases there may be results that are confusing or concerning to you. Please understand that not all results are received at the same time and often the doctors may need to   interpret multiple results in order to provide you with the best plan of care or course of treatment. Therefore, we ask that you please give us 2 business days to thoroughly review all your results before contacting the office for clarification. Should we see a critical lab result, you will be contacted sooner.   If You Need Anything After Your Visit  If you have any questions or concerns for your doctor, please call our main line at [phone] and press option 4 to reach your doctor's medical assistant. If no one answers, please leave a voicemail as directed and we will return your call as soon as possible. Messages  left after 4 pm will be answered the following business day.   You may also send us a message via MyChart. We typically respond to MyChart messages within 1-2 business days.  For prescription refills, please ask your pharmacy to contact our office. Our fax number is [phone].  If you have an urgent issue when the clinic is closed that cannot wait until the next business day, you can page your doctor at the number below.    Please note that while we do our best to be available for urgent issues outside of office hours, we are not available 24/7.   If you have an urgent issue and are unable to reach us, you may choose to seek medical care at your doctor's office, retail clinic, urgent care center, or emergency room.  If you have a medical emergency, please immediately call 911 or go to the emergency department.  Pager Numbers  - Dr. Kowalski: [phone]  - Dr. Moye: [phone]  - Dr. Stewart: [phone]  In the event of inclement weather, please call our main line at [phone] for an update on the status of any delays or closures.  Dermatology Medication Tips: Please keep the boxes that topical medications come in in order to help keep track of the instructions about where and how to use these. Pharmacies typically print the medication instructions only on the boxes and not directly on the medication tubes.   If your medication is too expensive, please contact our office at [phone] option 4 or send us a message through MyChart.   We are unable to tell what your co-pay for medications will be in advance as this is different depending on your insurance coverage. However, we may be able to find a substitute medication at lower cost or fill out paperwork to get insurance to cover a needed medication.   If a prior authorization is required to get your medication covered by your insurance company, please allow us 1-2 business days to complete this process.  Drug prices  often vary depending on where the prescription is filled and some pharmacies may offer cheaper prices.  The website www.goodrx.com contains coupons for medications through different pharmacies. The prices here do not account for what the cost may be with help from insurance (it may be cheaper with your insurance), but the website can give you the price if you did not use any insurance.  - You can print the associated coupon and take it with your prescription to the pharmacy.  - You may also stop by our office during regular business hours and pick up a GoodRx coupon card.  - If you need your prescription sent electronically to a different pharmacy, notify our office through [facility] MyChart or by phone at [phone] option 4.     Si Usted Necesita Algo Despus de Su Visita    Tambin puede enviarnos un mensaje a travs de MyChart. Por lo general respondemos a los mensajes de MyChart en el transcurso de 1 a 2 das hbiles.  Para renovar recetas, por favor pida a su farmacia que se ponga en contacto con nuestra oficina. Nuestro nmero de fax es el [phone].  Si tiene un asunto urgente cuando la clnica est cerrada y que no puede esperar hasta el siguiente da hbil, puede llamar/localizar a su doctor(a) al nmero que aparece a continuacin.   Por favor, tenga en cuenta que aunque hacemos todo lo posible para estar disponibles para asuntos urgentes fuera del horario de oficina, no estamos disponibles las 24 horas del da, los 7 das de la semana.   Si tiene un problema urgente y no puede comunicarse con nosotros, puede optar por buscar atencin mdica  en el consultorio de su doctor(a), en una clnica privada, en un centro de atencin urgente o en una sala de emergencias.  Si tiene una emergencia mdica, por favor llame inmediatamente al 911 o vaya a la sala de emergencias.  Nmeros de bper  - Dr. Kowalski: [phone]  - Dra. Moye: [phone]  - Dra. Stewart:  [phone]  En caso de inclemencias del tiempo, por favor llame a nuestra lnea principal al [phone] para una actualizacin sobre el estado de cualquier retraso o cierre.  Consejos para la medicacin en dermatologa: Por favor, guarde las cajas en las que vienen los medicamentos de uso tpico para ayudarle a seguir las instrucciones sobre dnde y cmo usarlos. Las farmacias generalmente imprimen las instrucciones del medicamento slo en las cajas y no directamente en los tubos del medicamento.   Si su medicamento es muy caro, por favor, pngase en contacto con nuestra oficina llamando al [phone] y presione la opcin 4 o envenos un mensaje a travs de MyChart.   No podemos decirle cul ser su copago por los medicamentos por adelantado ya que esto es diferente dependiendo de la cobertura de su seguro. Sin embargo, es posible que podamos encontrar un medicamento sustituto a menor costo o llenar un formulario para que el seguro cubra el medicamento que se considera necesario.   Si se requiere una autorizacin previa para que su compaa de seguros cubra su medicamento, por favor permtanos de 1 a 2 das hbiles para completar este proceso.  Los precios de los medicamentos varan con frecuencia dependiendo del lugar de dnde se surte la receta y alguna farmacias pueden ofrecer precios ms baratos.  El sitio web www.goodrx.com tiene cupones para medicamentos de diferentes farmacias. Los precios aqu no tienen en cuenta lo que podra costar con la ayuda del seguro (puede ser ms barato con su seguro), pero el sitio web puede darle el precio si no utiliz ningn seguro.  - Puede imprimir el cupn correspondiente y llevarlo con su receta a la farmacia.  - Tambin puede pasar por nuestra oficina durante el horario de atencin regular y recoger una tarjeta de cupones de GoodRx.  - Si necesita que su receta se enve electrnicamente a una farmacia diferente, informe a nuestra oficina a travs de  MyChart de [facility] o por telfono llamando al [phone] y presione la opcin 4.  

## 2023-01-03 NOTE — Progress Notes (Signed)
Follow-Up Visit   Subjective  Shelby Curtis is a 37 y.o. female who presents for the following: Irritated nevi (On the L neck and post neck - patient would like them removed today). The patient has spots, moles and lesions to be evaluated, some may be new or changing.   The following portions of the chart were reviewed this encounter and updated as appropriate:   Tobacco  Allergies  Meds  Problems  Med Hx  Surg Hx  Fam Hx      Review of Systems:  No other skin or systemic complaints except as noted in HPI or Assessment and Plan.  Objective  Well appearing patient in no apparent distress; mood and affect are within normal limits.  A focused examination was performed including the face and neck. Relevant physical exam findings are noted in the Assessment and Plan.  L lat neck sup 0.3 cm erythematous tan papule.      L lat neck inf 0.4 cm erythematous tan papule.  L post neck 0.4 cm brown erythematous papule.  Mid  post neck 0.5 cm erythematous tan and brown papule.   L forearm 0.35 cm thin dark brown papule with scalloped boarder.         Assessment & Plan  Neoplasm of uncertain behavior of skin (4) L lat neck sup  Epidermal / dermal shaving  Lesion diameter (cm):  0.3 Informed consent: discussed and consent obtained   Timeout: patient name, date of birth, surgical site, and procedure verified   Procedure prep:  Patient was prepped and draped in usual sterile fashion Prep type:  Isopropyl alcohol Anesthesia: the lesion was anesthetized in a standard fashion   Anesthetic:  1% lidocaine w/ epinephrine 1-100,000 buffered w/ 8.4% NaHCO3 Instrument used: flexible razor blade   Hemostasis achieved with: pressure, aluminum chloride and electrodesiccation   Outcome: patient tolerated procedure well   Post-procedure details: sterile dressing applied and wound care instructions given   Dressing type: bandage and petrolatum    Specimen 1 - Surgical  pathology Differential Diagnosis: D48.5 r/o irritated skin tag vs nevus or other Check Margins: No  L lat neck inf  Epidermal / dermal shaving  Lesion diameter (cm):  0.4 Informed consent: discussed and consent obtained   Timeout: patient name, date of birth, surgical site, and procedure verified   Procedure prep:  Patient was prepped and draped in usual sterile fashion Prep type:  Isopropyl alcohol Anesthesia: the lesion was anesthetized in a standard fashion   Anesthetic:  1% lidocaine w/ epinephrine 1-100,000 buffered w/ 8.4% NaHCO3 Instrument used: flexible razor blade   Hemostasis achieved with: pressure, aluminum chloride and electrodesiccation   Outcome: patient tolerated procedure well   Post-procedure details: sterile dressing applied and wound care instructions given   Dressing type: bandage and petrolatum    Specimen 2 - Surgical pathology Differential Diagnosis: D48.5 r/o irritated skin tag vs nevus or other Check Margins: No  L post neck  Epidermal / dermal shaving  Lesion diameter (cm):  0.4 Informed consent: discussed and consent obtained   Timeout: patient name, date of birth, surgical site, and procedure verified   Procedure prep:  Patient was prepped and draped in usual sterile fashion Prep type:  Isopropyl alcohol Anesthesia: the lesion was anesthetized in a standard fashion   Anesthetic:  1% lidocaine w/ epinephrine 1-100,000 buffered w/ 8.4% NaHCO3 Instrument used: flexible razor blade   Hemostasis achieved with: pressure, aluminum chloride and electrodesiccation   Outcome: patient tolerated procedure  well   Post-procedure details: sterile dressing applied and wound care instructions given   Dressing type: bandage and petrolatum    Specimen 3 - Surgical pathology Differential Diagnosis: D48.5 irritated nevus vs other Check Margins: No  Mid  post neck  Epidermal / dermal shaving  Lesion diameter (cm):  0.5 Informed consent: discussed and consent  obtained   Timeout: patient name, date of birth, surgical site, and procedure verified   Procedure prep:  Patient was prepped and draped in usual sterile fashion Prep type:  Isopropyl alcohol Anesthesia: the lesion was anesthetized in a standard fashion   Anesthetic:  1% lidocaine w/ epinephrine 1-100,000 buffered w/ 8.4% NaHCO3 Instrument used: flexible razor blade   Hemostasis achieved with: pressure, aluminum chloride and electrodesiccation   Outcome: patient tolerated procedure well   Post-procedure details: sterile dressing applied and wound care instructions given   Dressing type: bandage and petrolatum    Specimen 4 - Surgical pathology Differential Diagnosis: D48.5 irritated nevus vs other Check Margins: No  Nevus L forearm  Benign-appearing.  Observation.  Call clinic for new or changing moles.  Recommend daily use of broad spectrum spf 30+ sunscreen to sun-exposed areas.    Melanocytic Nevi - Tan-brown and/or pink-flesh-colored symmetric macules and papules - Benign appearing on exam today - Observation - Call clinic for new or changing moles - Recommend daily use of broad spectrum spf 30+ sunscreen to sun-exposed areas.   Return in about 3 months (around 04/05/2023) for TBSE.  Luther Redo, CMA, am acting as scribe for Forest Gleason, MD .  Documentation: I have reviewed the above documentation for accuracy and completeness, and I agree with the above.  Forest Gleason, MD

## 2023-01-08 ENCOUNTER — Encounter: Payer: Self-pay | Admitting: Dermatology

## 2023-01-10 ENCOUNTER — Telehealth: Payer: Self-pay

## 2023-01-10 NOTE — Telephone Encounter (Signed)
-----   Message from Alfonso Patten, MD sent at 01/10/2023  9:47 AM EDT ----- 1. Skin , left lat neck sup [place] Skin tag, benign, no additional treatment needed.    2. Skin , left lat neck inf [place] Skin tag, benign, no additional treatment needed.   3. Skin , left post neck MELANOCYTIC NEVUS, INTRADERMAL TYPE  This is a NORMAL MOLE. No additional treatment is needed. If you notice any new or changing spots or have other skin concerns in future, please call our office at [phone].    4. Skin , mid post neck MELANOCYTIC NEVUS, INTRADERMAL TYPE  This is a NORMAL MOLE. No additional treatment is needed. If you notice any new or changing spots or have other skin concerns in future, please call our office at [phone].      MAs please call. Thank you!

## 2023-01-10 NOTE — Telephone Encounter (Signed)
Discussed pathology results. Patient voiced understanding.  

## 2023-04-05 ENCOUNTER — Encounter: Payer: Medicaid Other | Admitting: Dermatology

## 2023-05-29 ENCOUNTER — Encounter: Payer: Medicaid Other | Admitting: Dermatology

## 2023-06-28 ENCOUNTER — Other Ambulatory Visit: Payer: Self-pay | Admitting: Family Medicine

## 2023-06-28 DIAGNOSIS — N644 Mastodynia: Secondary | ICD-10-CM

## 2023-07-11 ENCOUNTER — Ambulatory Visit
Admission: RE | Admit: 2023-07-11 | Discharge: 2023-07-11 | Disposition: A | Discharge status: Home / Self Care | Payer: Medicaid Other | Source: Ambulatory Visit | Attending: Family Medicine | Admitting: Family Medicine

## 2023-07-11 DIAGNOSIS — N644 Mastodynia: Secondary | ICD-10-CM | POA: Insufficient documentation

## 2024-03-18 ENCOUNTER — Emergency Department
Admission: EM | Admit: 2024-03-18 | Discharge: 2024-03-18 | Disposition: A | Discharge status: Home / Self Care | Attending: Emergency Medicine | Admitting: Emergency Medicine

## 2024-03-18 ENCOUNTER — Emergency Department

## 2024-03-18 ENCOUNTER — Other Ambulatory Visit: Payer: Self-pay

## 2024-03-18 DIAGNOSIS — M5412 Radiculopathy, cervical region: Secondary | ICD-10-CM | POA: Insufficient documentation

## 2024-03-18 DIAGNOSIS — S060X0A Concussion without loss of consciousness, initial encounter: Secondary | ICD-10-CM | POA: Diagnosis not present

## 2024-03-18 DIAGNOSIS — S161XXA Strain of muscle, fascia and tendon at neck level, initial encounter: Secondary | ICD-10-CM | POA: Insufficient documentation

## 2024-03-18 DIAGNOSIS — Y92410 Unspecified street and highway as the place of occurrence of the external cause: Secondary | ICD-10-CM | POA: Diagnosis not present

## 2024-03-18 DIAGNOSIS — K5900 Constipation, unspecified: Secondary | ICD-10-CM | POA: Insufficient documentation

## 2024-03-18 DIAGNOSIS — S0990XA Unspecified injury of head, initial encounter: Secondary | ICD-10-CM | POA: Diagnosis present

## 2024-03-18 LAB — CBC WITH DIFFERENTIAL/PLATELET
Abs Immature Granulocytes: 0.05 10*3/uL (ref 0.00–0.07)
Basophils Absolute: 0 10*3/uL (ref 0.0–0.1)
Basophils Relative: 0 %
Eosinophils Absolute: 0.1 10*3/uL (ref 0.0–0.5)
Eosinophils Relative: 0 %
HCT: 38.7 % (ref 36.0–46.0)
Hemoglobin: 12.6 g/dL (ref 12.0–15.0)
Immature Granulocytes: 0 %
Lymphocytes Relative: 20 %
Lymphs Abs: 2.8 10*3/uL (ref 0.7–4.0)
MCH: 26 pg (ref 26.0–34.0)
MCHC: 32.6 g/dL (ref 30.0–36.0)
MCV: 79.8 fL — ABNORMAL LOW (ref 80.0–100.0)
Monocytes Absolute: 0.7 10*3/uL (ref 0.1–1.0)
Monocytes Relative: 5 %
Neutro Abs: 10.1 10*3/uL — ABNORMAL HIGH (ref 1.7–7.7)
Neutrophils Relative %: 75 %
Platelets: 366 10*3/uL (ref 150–400)
RBC: 4.85 MIL/uL (ref 3.87–5.11)
RDW: 13.5 % (ref 11.5–15.5)
WBC: 13.7 10*3/uL — ABNORMAL HIGH (ref 4.0–10.5)
nRBC: 0 % (ref 0.0–0.2)

## 2024-03-18 LAB — URINALYSIS, COMPLETE (UACMP) WITH MICROSCOPIC
Bilirubin Urine: NEGATIVE
Glucose, UA: NEGATIVE mg/dL
Ketones, ur: NEGATIVE mg/dL
Leukocytes,Ua: NEGATIVE
Nitrite: POSITIVE — AB
Protein, ur: NEGATIVE mg/dL
Specific Gravity, Urine: 1.013 (ref 1.005–1.030)
pH: 5 (ref 5.0–8.0)

## 2024-03-18 LAB — BASIC METABOLIC PANEL WITH GFR
Anion gap: 10 (ref 5–15)
BUN: 25 mg/dL — ABNORMAL HIGH (ref 6–20)
CO2: 21 mmol/L — ABNORMAL LOW (ref 22–32)
Calcium: 9.2 mg/dL (ref 8.9–10.3)
Chloride: 107 mmol/L (ref 98–111)
Creatinine, Ser: 0.99 mg/dL (ref 0.44–1.00)
GFR, Estimated: >60 mL/min (ref >60)
Glucose, Bld: 97 mg/dL (ref 70–99)
Potassium: 3.8 mmol/L (ref 3.5–5.1)
Sodium: 138 mmol/L (ref 135–145)

## 2024-03-18 LAB — POC URINE PREG, ED: Preg Test, Ur: NEGATIVE

## 2024-03-18 MED ORDER — SENNA-DOCUSATE SODIUM 8.6-50 MG PO TABS: 2.0000 | ORAL_TABLET | Freq: Every day | ORAL | 0 refills | Status: AC | PRN

## 2024-03-18 MED ORDER — CYCLOBENZAPRINE HCL 5 MG PO TABS: 5.0000 mg | ORAL_TABLET | Freq: Three times a day (TID) | ORAL | 0 refills | Status: DC | PRN

## 2024-03-18 MED ORDER — PREDNISONE 10 MG PO TABS: ORAL_TABLET | ORAL | 0 refills | Status: AC

## 2024-03-18 MED ORDER — DEXAMETHASONE SODIUM PHOSPHATE 10 MG/ML IJ SOLN
10.0000 mg | Freq: Once | INTRAMUSCULAR | Status: AC
  Administered 2024-03-18: 10 mg via INTRAMUSCULAR
  Filled 2024-03-18: qty 1

## 2024-03-18 MED ORDER — ONDANSETRON 4 MG PO TBDP
4.0000 mg | ORAL_TABLET | Freq: Three times a day (TID) | ORAL | 0 refills | Status: AC | PRN
Start: 2024-03-18 — End: ?

## 2024-03-18 MED ORDER — ONDANSETRON 4 MG PO TBDP
4.0000 mg | ORAL_TABLET | Freq: Once | ORAL | Status: AC
  Administered 2024-03-18: 4 mg via ORAL
  Filled 2024-03-18: qty 1

## 2024-03-18 MED ORDER — CYCLOBENZAPRINE HCL 10 MG PO TABS
10.0000 mg | ORAL_TABLET | Freq: Once | ORAL | Status: AC
  Administered 2024-03-18: 10 mg via ORAL
  Filled 2024-03-18: qty 1

## 2024-03-18 MED ORDER — BISACODYL 10 MG RE SUPP: 10.0000 mg | Freq: Every day | RECTAL | 0 refills | Status: AC | PRN

## 2024-03-18 NOTE — Discharge Instructions (Signed)
 Please take medications as prescribed.  Continue with outpatient physical therapy.  Return to the emergency department if you develop any severe pain, weakness or any worsening symptoms.  Return to the emergency department if you have any severe headaches, continued nausea, vomiting or abdominal pain.
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
## 2024-04-10 ENCOUNTER — Encounter: Payer: Self-pay | Admitting: *Deleted

## 2024-04-10 ENCOUNTER — Other Ambulatory Visit: Payer: Self-pay

## 2024-04-10 ENCOUNTER — Emergency Department
Admission: EM | Admit: 2024-04-10 | Discharge: 2024-04-10 | Disposition: A | Discharge status: Home / Self Care | Attending: Emergency Medicine | Admitting: Emergency Medicine

## 2024-04-10 DIAGNOSIS — R200 Anesthesia of skin: Secondary | ICD-10-CM | POA: Insufficient documentation

## 2024-04-10 DIAGNOSIS — M542 Cervicalgia: Secondary | ICD-10-CM | POA: Diagnosis present

## 2024-04-10 DIAGNOSIS — M62838 Other muscle spasm: Secondary | ICD-10-CM

## 2024-04-10 DIAGNOSIS — S161XXA Strain of muscle, fascia and tendon at neck level, initial encounter: Secondary | ICD-10-CM

## 2024-04-10 DIAGNOSIS — R194 Change in bowel habit: Secondary | ICD-10-CM | POA: Diagnosis not present

## 2024-04-10 DIAGNOSIS — S161XXD Strain of muscle, fascia and tendon at neck level, subsequent encounter: Secondary | ICD-10-CM | POA: Diagnosis not present

## 2024-04-10 DIAGNOSIS — R198 Other specified symptoms and signs involving the digestive system and abdomen: Secondary | ICD-10-CM

## 2024-04-10 LAB — CBC
HCT: 34.4 % — ABNORMAL LOW (ref 36.0–46.0)
Hemoglobin: 11.4 g/dL — ABNORMAL LOW (ref 12.0–15.0)
MCH: 26.8 pg (ref 26.0–34.0)
MCHC: 33.1 g/dL (ref 30.0–36.0)
MCV: 80.9 fL (ref 80.0–100.0)
Platelets: 288 10*3/uL (ref 150–400)
RBC: 4.25 MIL/uL (ref 3.87–5.11)
RDW: 14.2 % (ref 11.5–15.5)
WBC: 13.5 10*3/uL — ABNORMAL HIGH (ref 4.0–10.5)
nRBC: 0 % (ref 0.0–0.2)

## 2024-04-10 LAB — LIPASE, BLOOD: Lipase: 32 U/L (ref 11–51)

## 2024-04-10 LAB — COMPREHENSIVE METABOLIC PANEL WITH GFR
ALT: 15 U/L (ref 0–44)
AST: 15 U/L (ref 15–41)
Albumin: 3.7 g/dL (ref 3.5–5.0)
Alkaline Phosphatase: 53 U/L (ref 38–126)
Anion gap: 11 (ref 5–15)
BUN: 17 mg/dL (ref 6–20)
CO2: 21 mmol/L — ABNORMAL LOW (ref 22–32)
Calcium: 9.1 mg/dL (ref 8.9–10.3)
Chloride: 108 mmol/L (ref 98–111)
Creatinine, Ser: 0.77 mg/dL (ref 0.44–1.00)
GFR, Estimated: >60 mL/min (ref >60)
Glucose, Bld: 96 mg/dL (ref 70–99)
Potassium: 3.3 mmol/L — ABNORMAL LOW (ref 3.5–5.1)
Sodium: 140 mmol/L (ref 135–145)
Total Bilirubin: 0.4 mg/dL (ref 0.0–1.2)
Total Protein: 6.8 g/dL (ref 6.5–8.1)

## 2024-04-10 MED ORDER — CYCLOBENZAPRINE HCL 5 MG PO TABS
5.0000 mg | ORAL_TABLET | Freq: Three times a day (TID) | ORAL | 0 refills | Status: AC | PRN
Start: 2024-04-10 — End: ?

## 2024-04-10 MED ORDER — CELECOXIB 200 MG PO CAPS: 200.0000 mg | ORAL_CAPSULE | Freq: Two times a day (BID) | ORAL | 0 refills | Status: AC

## 2024-04-10 MED ORDER — IBUPROFEN 800 MG PO TABS
800.0000 mg | ORAL_TABLET | Freq: Once | ORAL | Status: AC
  Administered 2024-04-10: 800 mg via ORAL
  Filled 2024-04-10: qty 1

## 2024-04-10 NOTE — Discharge Instructions (Addendum)
 Please take medications as prescribed.  Please follow-up with gastroenterologist and orthopedist.  Return to the ER for any fevers increasing pain worsening symptoms or any urgent changes in your health
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
## 2024-08-07 ENCOUNTER — Encounter: Payer: Self-pay | Admitting: Family Medicine

## 2024-10-03 ENCOUNTER — Other Ambulatory Visit: Payer: Self-pay | Admitting: Primary Care

## 2024-10-03 DIAGNOSIS — N63 Unspecified lump in unspecified breast: Secondary | ICD-10-CM

## 2024-10-14 ENCOUNTER — Ambulatory Visit
Admission: RE | Admit: 2024-10-14 | Discharge: 2024-10-14 | Disposition: A | Discharge status: Home / Self Care | Source: Ambulatory Visit | Attending: Primary Care | Admitting: Primary Care

## 2024-10-14 DIAGNOSIS — N63 Unspecified lump in unspecified breast: Secondary | ICD-10-CM | POA: Insufficient documentation

## 2024-10-20 ENCOUNTER — Ambulatory Visit: Admitting: Sleep Medicine

## 2024-10-27 ENCOUNTER — Encounter: Payer: Self-pay | Admitting: Primary Care

## 2024-10-30 ENCOUNTER — Other Ambulatory Visit: Payer: Self-pay | Admitting: Primary Care

## 2024-10-30 DIAGNOSIS — R928 Other abnormal and inconclusive findings on diagnostic imaging of breast: Secondary | ICD-10-CM

## 2024-11-05 ENCOUNTER — Ambulatory Visit
Admission: RE | Admit: 2024-11-05 | Discharge: 2024-11-05 | Disposition: A | Discharge status: Home / Self Care | Source: Ambulatory Visit | Attending: Primary Care | Admitting: Primary Care

## 2024-11-05 DIAGNOSIS — R928 Other abnormal and inconclusive findings on diagnostic imaging of breast: Secondary | ICD-10-CM | POA: Insufficient documentation

## 2024-11-05 DIAGNOSIS — N6012 Diffuse cystic mastopathy of left breast: Secondary | ICD-10-CM | POA: Insufficient documentation

## 2024-11-05 HISTORY — PX: BREAST BIOPSY: SHX20

## 2024-11-05 MED ORDER — LIDOCAINE 1 % OPTIME INJ - NO CHARGE
2.0000 mL | Freq: Once | INTRAMUSCULAR | Status: AC
  Administered 2024-11-05: 2 mL via INTRADERMAL
  Filled 2024-11-05: qty 2

## 2024-11-05 MED ORDER — LIDOCAINE-EPINEPHRINE 1 %-1:100000 IJ SOLN
10.0000 mL | Freq: Once | INTRAMUSCULAR | Status: AC
  Administered 2024-11-05: 10 mL via INTRADERMAL
  Filled 2024-11-05: qty 10

## 2024-11-06 LAB — SURGICAL PATHOLOGY

## 2024-11-10 ENCOUNTER — Ambulatory Visit: Admitting: Sleep Medicine

## 2024-11-10 ENCOUNTER — Encounter: Payer: Self-pay | Admitting: Sleep Medicine

## 2024-11-10 VITALS — BP 120/80 | HR 94 | Temp 98.7°F | Ht 64.0 in | Wt 237.6 lb

## 2024-11-10 DIAGNOSIS — G4710 Hypersomnia, unspecified: Secondary | ICD-10-CM | POA: Diagnosis not present

## 2024-11-10 DIAGNOSIS — Z6841 Body Mass Index (BMI) 40.0 and over, adult: Secondary | ICD-10-CM

## 2024-11-10 DIAGNOSIS — Z87891 Personal history of nicotine dependence: Secondary | ICD-10-CM | POA: Diagnosis not present

## 2024-11-10 DIAGNOSIS — G4700 Insomnia, unspecified: Secondary | ICD-10-CM | POA: Diagnosis not present

## 2024-11-10 DIAGNOSIS — F5104 Psychophysiologic insomnia: Secondary | ICD-10-CM

## 2024-11-10 DIAGNOSIS — G4733 Obstructive sleep apnea (adult) (pediatric): Secondary | ICD-10-CM

## 2024-11-10 NOTE — Patient Instructions (Signed)
 Shelby Curtis

## 2024-11-27 ENCOUNTER — Telehealth: Payer: Self-pay

## 2024-11-27 NOTE — Telephone Encounter (Signed)
 Order was not sent to [facility] until 02/03. I had wait on Dr. Reddy's note to be completed

## 2024-11-27 NOTE — Telephone Encounter (Unsigned)
 Copied from CRM [id number]. Topic: Clinical - Order For Equipment >> Nov 27, 2024  3:13 PM Shelby Curtis wrote: Reason for CRM: pt called stated she has not rec'd her home sleep study and she registered to get equipment 01/19 but no equipment and was advised to call the office if not rec'd
